# Patient Record
Sex: Female | Born: 1965 | Race: White | Hispanic: No | Marital: Married | Smoking: Current some day smoker
Health system: Southern US, Community
[De-identification: ages and names within clinical notes are randomized; demographics above are authoritative.]

## PROBLEM LIST (undated history)

## (undated) DIAGNOSIS — E785 Hyperlipidemia, unspecified: Secondary | ICD-10-CM

## (undated) DIAGNOSIS — R42 Dizziness and giddiness: Secondary | ICD-10-CM

## (undated) DIAGNOSIS — K219 Gastro-esophageal reflux disease without esophagitis: Secondary | ICD-10-CM

## (undated) DIAGNOSIS — R5383 Other fatigue: Secondary | ICD-10-CM

## (undated) DIAGNOSIS — R112 Nausea with vomiting, unspecified: Secondary | ICD-10-CM

## (undated) DIAGNOSIS — I10 Essential (primary) hypertension: Secondary | ICD-10-CM

## (undated) DIAGNOSIS — F419 Anxiety disorder, unspecified: Secondary | ICD-10-CM

## (undated) DIAGNOSIS — R1310 Dysphagia, unspecified: Secondary | ICD-10-CM

## (undated) DIAGNOSIS — M1990 Unspecified osteoarthritis, unspecified site: Secondary | ICD-10-CM

## (undated) DIAGNOSIS — R197 Diarrhea, unspecified: Secondary | ICD-10-CM

## (undated) DIAGNOSIS — G43909 Migraine, unspecified, not intractable, without status migrainosus: Secondary | ICD-10-CM

## (undated) DIAGNOSIS — Z9889 Other specified postprocedural states: Secondary | ICD-10-CM

## (undated) HISTORY — PX: TUBAL LIGATION: SHX77

## (undated) HISTORY — DX: Gastro-esophageal reflux disease without esophagitis: K21.9

## (undated) HISTORY — DX: Dysphagia, unspecified: R13.10

## (undated) HISTORY — DX: Hyperlipidemia, unspecified: E78.5

## (undated) HISTORY — DX: Essential (primary) hypertension: I10

## (undated) HISTORY — DX: Other fatigue: R53.83

## (undated) HISTORY — DX: Diarrhea, unspecified: R19.7

---

## 1998-10-14 ENCOUNTER — Other Ambulatory Visit: Admission: RE | Admit: 1998-10-14 | Discharge: 1998-10-14 | Payer: Self-pay | Admitting: Obstetrics and Gynecology

## 1999-09-11 ENCOUNTER — Other Ambulatory Visit: Admission: RE | Admit: 1999-09-11 | Discharge: 1999-09-11 | Payer: Self-pay | Admitting: Obstetrics and Gynecology

## 2000-09-04 ENCOUNTER — Other Ambulatory Visit: Admission: RE | Admit: 2000-09-04 | Discharge: 2000-09-04 | Payer: Self-pay | Admitting: Obstetrics and Gynecology

## 2002-01-15 HISTORY — PX: KNEE CARTILAGE SURGERY: SHX688

## 2002-09-23 ENCOUNTER — Other Ambulatory Visit: Admission: RE | Admit: 2002-09-23 | Discharge: 2002-09-23 | Payer: Self-pay | Admitting: Obstetrics and Gynecology

## 2003-10-18 ENCOUNTER — Inpatient Hospital Stay (HOSPITAL_COMMUNITY): Admission: AD | Admit: 2003-10-18 | Discharge: 2003-10-18 | Payer: Self-pay | Admitting: Obstetrics and Gynecology

## 2003-10-21 ENCOUNTER — Inpatient Hospital Stay (HOSPITAL_COMMUNITY): Admission: AD | Admit: 2003-10-21 | Discharge: 2003-10-24 | Payer: Self-pay | Admitting: Obstetrics and Gynecology

## 2003-12-03 ENCOUNTER — Encounter: Admission: RE | Admit: 2003-12-03 | Discharge: 2003-12-03 | Payer: Self-pay | Admitting: Obstetrics and Gynecology

## 2003-12-03 ENCOUNTER — Other Ambulatory Visit: Admission: RE | Admit: 2003-12-03 | Discharge: 2003-12-03 | Payer: Self-pay | Admitting: Obstetrics and Gynecology

## 2004-03-17 ENCOUNTER — Ambulatory Visit (HOSPITAL_COMMUNITY): Admission: RE | Admit: 2004-03-17 | Discharge: 2004-03-17 | Payer: Self-pay | Admitting: Obstetrics and Gynecology

## 2004-06-06 ENCOUNTER — Encounter: Admission: RE | Admit: 2004-06-06 | Discharge: 2004-09-04 | Payer: Self-pay | Admitting: *Deleted

## 2004-06-29 ENCOUNTER — Ambulatory Visit (HOSPITAL_COMMUNITY): Admission: RE | Admit: 2004-06-29 | Discharge: 2004-06-29 | Payer: Self-pay | Admitting: *Deleted

## 2004-07-04 ENCOUNTER — Ambulatory Visit (HOSPITAL_COMMUNITY): Admission: RE | Admit: 2004-07-04 | Discharge: 2004-07-04 | Payer: Self-pay | Admitting: *Deleted

## 2004-09-20 ENCOUNTER — Encounter: Admission: RE | Admit: 2004-09-20 | Discharge: 2004-12-19 | Payer: Self-pay | Admitting: *Deleted

## 2004-11-15 HISTORY — PX: LAPAROSCOPIC GASTRIC BANDING: SHX1100

## 2004-12-05 ENCOUNTER — Encounter (INDEPENDENT_AMBULATORY_CARE_PROVIDER_SITE_OTHER): Payer: Self-pay | Admitting: Specialist

## 2004-12-05 ENCOUNTER — Observation Stay (HOSPITAL_COMMUNITY): Admission: RE | Admit: 2004-12-05 | Discharge: 2004-12-06 | Payer: Self-pay | Admitting: *Deleted

## 2004-12-27 ENCOUNTER — Encounter: Admission: RE | Admit: 2004-12-27 | Discharge: 2005-03-27 | Payer: Self-pay | Admitting: *Deleted

## 2005-04-12 ENCOUNTER — Encounter: Admission: RE | Admit: 2005-04-12 | Discharge: 2005-04-12 | Payer: Self-pay | Admitting: *Deleted

## 2005-11-21 ENCOUNTER — Encounter: Admission: RE | Admit: 2005-11-21 | Discharge: 2006-02-19 | Payer: Self-pay | Admitting: *Deleted

## 2006-05-28 IMAGING — RF DG UGI W/ GASTROGRAFIN
7 series · 11 of 11 positions shown · non-contrast
Comparison: none

CLINICAL DATA: Status post gastric banding yesterday for morbid obesity.

UPPER GI SERIES W/ KUB:
TECHNIQUE: After obtaining a scout radiograph, a single contrast upper GI
series was performed using 50 cc of Gastrografin.

[Series 1: run · 1 of 1 slices shown (1 of 5)]
[im 1/1]
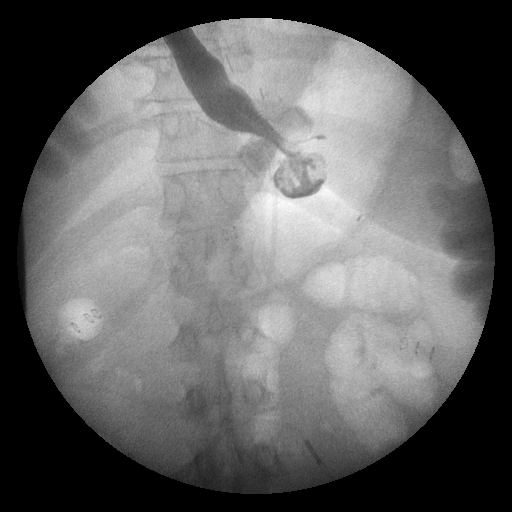

[Series 2: run · 1 of 1 slices shown (2 of 5)]
[im 1/1]
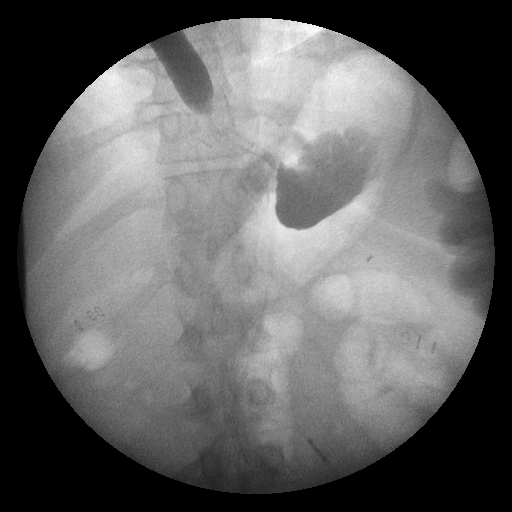

[Series 3: run · 1 of 1 slices shown (3 of 5)]
[im 1/1]
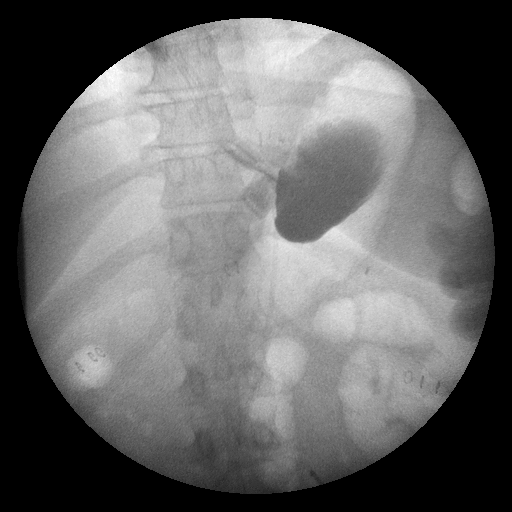

[Series 4: run · 3 of 3 slices shown (4 of 5)]
[im 1/3]
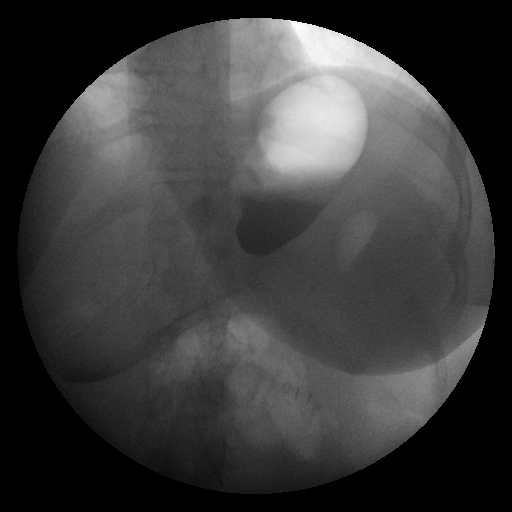
[im 2/3]
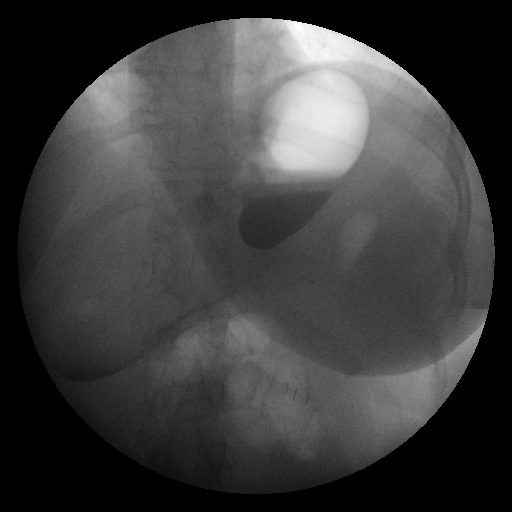
[im 3/3]
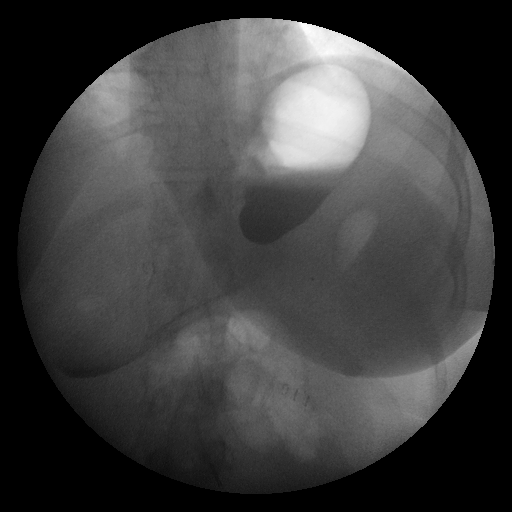

[Series 5: run · 3 of 3 slices shown (5 of 5)]
[im 1/3]
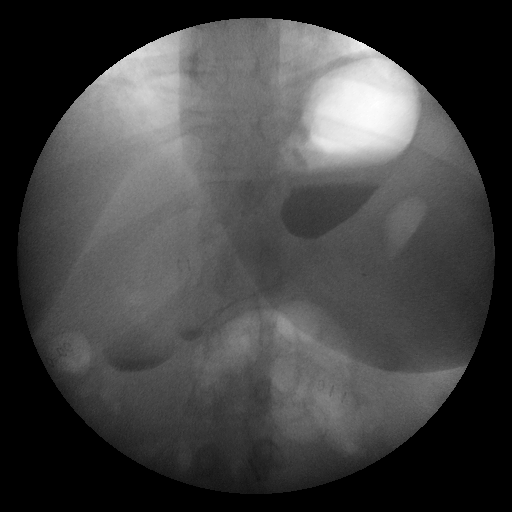
[im 2/3]
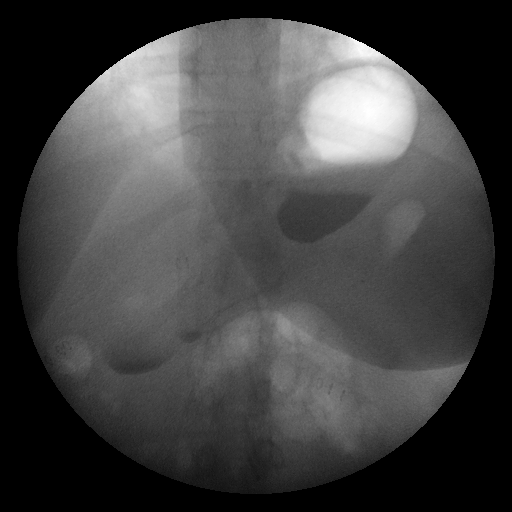
[im 3/3]
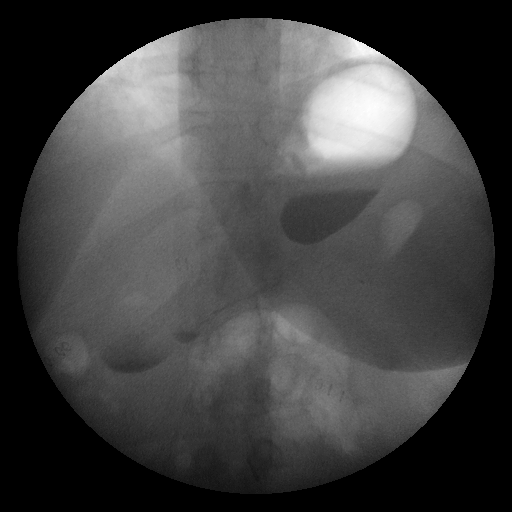

[Series 1001: view not recorded · 0.20mm/px · 1 of 1 slices shown (1 of 2)]
[im 1/1]
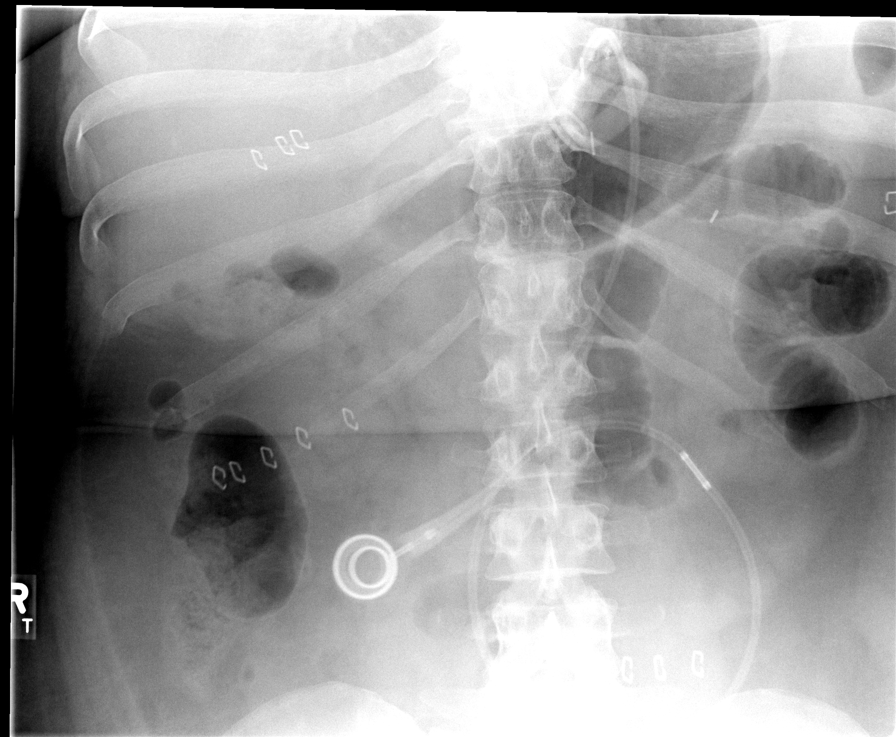

[Series 1002: view not recorded · 0.20mm/px · 1 of 1 slices shown (2 of 2)]
[im 1/1]
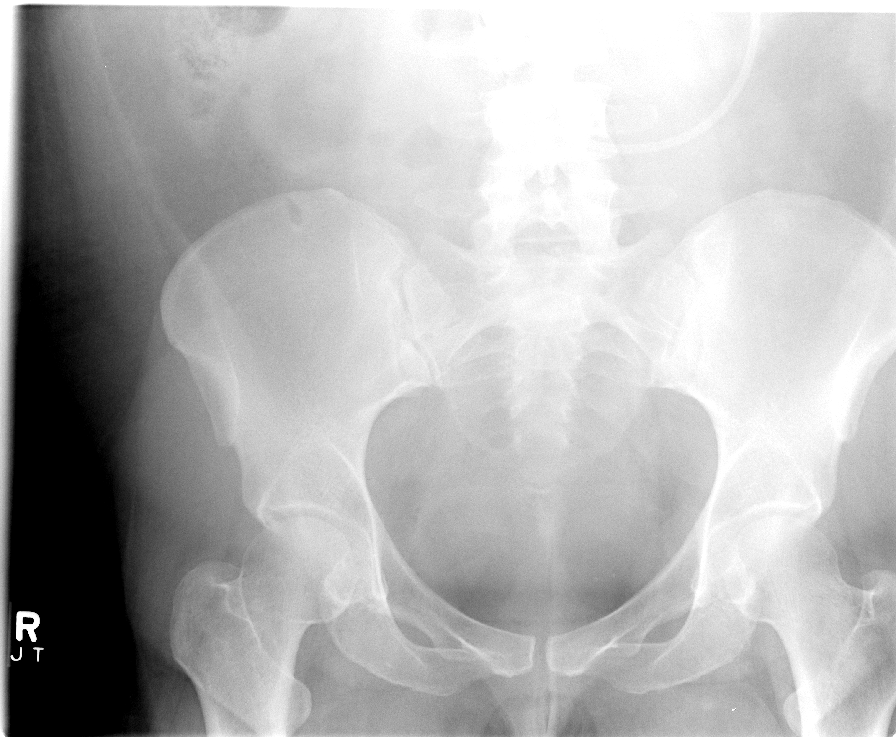

[11 of 11 positions shown; findings below may reference images not displayed]

FINDINGS: The preliminary supine radiographs of the abdomen demonstrate a
gastric band, reservoir and connecting tubing in their expected positions. Skin
clips are noted. Normal bowel gas pattern. Lower thoracic spine degenerative
changes.

The patient swallowed the Gastrografin without difficulty. The distal esophagus
and proximal stomach have normal appearances. No contrast extravasation was
demonstrated and there was free flow of contrast through the gastric band and
into the stomach more distally.
IMPRESSION: Normal examination, status post gastric banding.

## 2007-02-14 ENCOUNTER — Encounter: Admission: RE | Admit: 2007-02-14 | Discharge: 2007-02-14 | Payer: Self-pay | Admitting: Obstetrics and Gynecology

## 2007-02-21 ENCOUNTER — Encounter: Admission: RE | Admit: 2007-02-21 | Discharge: 2007-02-21 | Payer: Self-pay | Admitting: Obstetrics and Gynecology

## 2007-02-28 ENCOUNTER — Inpatient Hospital Stay (HOSPITAL_COMMUNITY): Admission: EM | Admit: 2007-02-28 | Discharge: 2007-03-01 | Payer: Self-pay | Admitting: Emergency Medicine

## 2010-02-05 ENCOUNTER — Encounter: Payer: Self-pay | Admitting: Obstetrics and Gynecology

## 2010-05-30 NOTE — H&P (Signed)
NAMEJULIETT, [name]                ACCOUNT NO.:  [id number]   MEDICAL RECORD NO.:  [id number]          PATIENT TYPE:  EMS   LOCATION:  ED                           FACILITY:  [facility]   PHYSICIAN:  Ollen Gross. Vernell Morgans, M.D. DATE OF BIRTH:  [birth year]   DATE OF ADMISSION:  02/28/2007  DATE OF DISCHARGE:                              HISTORY & PHYSICAL   Ms. [name] is a 45 year old white female who recently had a lap band  procedure done by Dr. Daphine Deutscher a few days ago.  The band was filled, and  afterwards the patient was unable to swallow and has had nausea and  vomiting since then. She has returned to the clinic the last 2 days to  have fluid removed from the band, but still is unable to swallow and is  continuing to vomit.  She denies any fevers.  She denies any abdominal  pain.  No real shortness of breath.   PAST MEDICAL HISTORY:  Obesity.   PAST SURGICAL HISTORY:  Lap band procedure.   MEDICATIONS:  Wellbutrin and multivitamins.   ALLERGIES:  NO KNOWN DRUG ALLERGIES.   SOCIAL HISTORY:  She does smoke.  Denies any alcohol use.   FAMILY HISTORY:  Noncontributory.   PHYSICAL EXAMINATION:  VITAL SIGNS:  Her temperature is 98.3, blood  pressure 118/58, pulse 74.  GENERAL:  She is a well-developed, well-nourished white female, in no  acute distress.  SKIN:  Warm and dry, no jaundice.  EYES:  Her extraocular muscles are intact.  Pupils equal, round, and  reactive to light.  Sclerae nonicteric.  LUNGS:  Clear bilaterally, with no use of accessory respiratory muscles.  HEART:  Regular rate and rhythm, with impulse in the left chest.  ABDOMEN: Soft and nontender.  No guarding or peritonitis.  The cord is  palpable in her right abdomen.  EXTREMITIES:  No cyanosis, clubbing, or edema.  Good strength in her  arms and legs.  PSYCHOLOGICAL:  She is alert and oriented x3, with no evidence of any  anxiety or depression.   ASSESSMENT AND PLAN:  This is a 45 year old white female who has had  some nausea and vomiting since her lab band was filled.  Although this  most likely may be able to be taken care by removing some more fluid  from the lap band, we will also check some abdominal x-rays to make sure  that her band is in good position, and some lab work.  We will plan to  admit her for IV hydration.  Will discuss this with Dr. Daphine Deutscher in the  morning.      Ollen Gross. Vernell Morgans, M.D.  Electronically Signed    PST/MEDQ  D:  02/28/2007  T:  03/03/2007  Job:  [id number]

## 2010-06-02 NOTE — H&P (Signed)
[name], [name]                ACCOUNT NO.:  [id number]   MEDICAL RECORD NO.:  [id number]          PATIENT TYPE:  AMB   LOCATION:  SDC                           FACILITY:  [facility]   PHYSICIAN:  Zenaida Niece, M.D.DATE OF BIRTH:  1965/08/31   DATE OF ADMISSION:  03/17/2004  DATE OF DISCHARGE:                                HISTORY & PHYSICAL   CHIEF COMPLAINT:  Desires surgical sterility and menorrhagia.   HISTORY OF PRESENT ILLNESS:  This is a 45 year old white female, gravida 4,  para 3-0-1-3, who had a vaginal delivery in October of 2005.  She had a  normal postpartum examination in November of 2005, and was initially put on  NuvaRing.  However, she failed to follow up with blood pressure, and wishes  to proceed with definitive surgical sterility.  She also complains of heavy  menses, and says that she may have to be in bed for 2 days due to heavy  bleeding.   PAST OBSTETRICAL HISTORY:  1.  Three vaginal deliveries at term without complications.  2.  A left ectopic pregnancy.   PAST MEDICAL HISTORY:  1.  Migraine headache.  2.  Morbid obesity.  3.  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  4.  History of hypercholesterolemia.  5.  History of depression.   PAST SURGICAL HISTORY:  1.  Laparoscopy for the ectopic pregnancy.  2.  Right knee arthroscopy.  3.  Tonsillectomy.  4.  I&D of a left breast abscess.   ALLERGIES:  None known.   CURRENT MEDICATIONS:  None.   SOCIAL HISTORY:  The patient is married and denies alcohol, tobacco, or drug  use.   FAMILY HISTORY:  No GYN or colon cancer.   PHYSICAL EXAMINATION:  VITAL SIGNS:  Weight is approximately 250 pounds.  Blood pressure is 156/98.  GENERAL:  This is an obese white female in no acute distress.  NECK:  Supple without lymphadenopathy or thyromegaly.  LUNGS:  Clear to auscultation.  HEART:  Regular rate and rhythm without murmur.  ABDOMEN:  Obese, nontender, nondistended, without palpable  masses.  EXTREMITIES:  No edema, and are nontender.  PELVIC:  External genitalia is within normal limits.  On speculum exam, her  cervix is normal.  On bimanual exam, she has an anteverted uterus, upper  limits of normal size, which is nontender, and she has no adnexal masses.   ASSESSMENT:  1.  Desires surgical sterility.  All contraceptive options have been      discussed with the patient.  She understands that tubal fulguration is      permanent, with a 1 in 150 failure rate, and an increased risk of a      tubal pregnancy.  Again, the patient has had her left tube removed      previously from an ectopic pregnancy.  Risks of surgery including      bleeding, infection, and damage to surrounding organs have also been      discussed with the patient.  2.  Menorrhagia.  The patient does not wish to use  oral contraceptives to      control this, and she also has elevated blood pressure.  Surgical      options, including endometrial ablation, have been discussed.   PLAN:  The plan is to admit the patient for a laparoscopy with tubal  fulguration of the right tube, followed by endometrial ablation with the  NovaSure device.      TDM/MEDQ  D:  03/16/2004  T:  03/16/2004  Job:  [id number]

## 2010-06-02 NOTE — Op Note (Signed)
[name], [name]                ACCOUNT NO.:  [id number]   MEDICAL RECORD NO.:  [id number]          PATIENT TYPE:  OBV   LOCATION:  1509                         FACILITY:  [facility]   PHYSICIAN:  Vikki Ports, MDDATE OF BIRTH:  1965/03/16   DATE OF PROCEDURE:  12/05/2004  DATE OF DISCHARGE:                                 OPERATIVE REPORT   PREOPERATIVE DIAGNOSIS:  Morbid obesity.   POSTOPERATIVE DIAGNOSIS:  Morbid obesity.   PROCEDURE:  Laparoscopic adjustable gastric banding with a 10 cm adjustable  gastric band and truncal vagotomy.   SURGEON:  Vikki Ports, MD   ASSISTANT:  Thornton Park. Daphine Deutscher, MD   ANESTHESIA:  General.   DESCRIPTION:  The patient was taken to the operating room and placed in a  supine position.  After adequate general anesthesia was induced using  endotracheal tube, the abdomen was prepped and draped in a normal sterile  fashion.  Using an 11 mm Optiview trocar in the left upper quadrant, under  direct visualization, abdominal access was obtained.  Pneumoperitoneum was  obtained.  Additional 12 mm trocars were placed throughout the abdomen.  A  Nathanson liver retractor was placed in the upper abdomen.  The left lateral  segment of the liver was retracted anteriorly.  Using sharp and blunt  dissection, the angle of His was identified and dissected.  Dissecting  through the pars flaccida, the right crus of the diaphragm was identified,  and a retroesophageal dissection was undertaken, identifying the posterior  vagus nerve.  This posterior trunk was clipped, both proximally and  distally, divided, and a 1 cm segment was resected and sent for gross  pathologic evaluation.  I then turned my attention to the anterior  esophagus, where the anterior vagus nerve was identified in the 11 o'clock  position.  It was clipped, divided, and a 1 cm segment was resected and sent  for gross evaluation.   The stomach had been previously irrigated with  simethicone and water.  An  Olympus endoscope was introduced into the oropharynx and down into the  stomach, retroflexed.  The mucosa of the stomach was then irrigated with  Congo red solution after Baclofen had been infused by the anesthesiologist  through IV administration.  This showed the mucosa of the stomach to stay  red with no change of pigment to black, showing a complete truncal vagotomy.  The Olympus endoscope was then removed.   I then turned my attention to the right crus of the diaphragm, and a  retroesophageal dissection was undertaken with the lap band passer up to the  angle of His.  A 10 cm adjustable gastric band was then placed through the  12 mm trocar in the right upper quadrant and introduced into the abdominal  cavity and passed into the retroesophageal space with the lap band passer.  It was snapped around the upper stomach with the dilator placed down through  the GE junction.  It moved freely.  Using serosal to serosal interrupted 2-0  Ethibond sutures, it was fixed anteriorly.  The buccal was left safely away  from the fixation.  I was satisfied with the placement of the band.  The  balloon was removed.  The [facility] liver retractor was removed.  The tubing  was brought out through the right mid abdominal port.  The port was fixed to  the tubing, and the port was fixed to the anterior abdominal fascia with  interrupted 2-0  Prolene sutures.  Pneumoperitoneum was released.  Trocars were removed.  Skin incisions were closed with subcutaneous 3-0 Vicryl sutures.  Skin  incisions were closed with staples.  Patient tolerated the procedure well  and went to the PACU in good condition.      Vikki Ports, MD  Electronically Signed     KRH/MEDQ  D:  12/05/2004  T:  12/05/2004  Job:  [id number]

## 2010-06-02 NOTE — Op Note (Signed)
[name], [name]                ACCOUNT NO.:  [id number]   MEDICAL RECORD NO.:  [id number]          PATIENT TYPE:  AMB   LOCATION:  SDC                           FACILITY:  [facility]   PHYSICIAN:  Zenaida Niece, M.D.DATE OF BIRTH:  06-25-1965   DATE OF PROCEDURE:  03/17/2004  DATE OF DISCHARGE:                                 OPERATIVE REPORT   PREOPERATIVE AND POSTOPERATIVE DIAGNOSES:  Desires surgical sterility and  menorrhagia.   PROCEDURES:  Laparoscopy with fulguration of right fallopian tube and  Novasure endometrial ablation.   SURGEON:  Zenaida Niece, M.D.   ANESTHESIA:  General.   ESTIMATED BLOOD LOSS:  Minimal.   FINDINGS:  The patient has a surgically absent left fallopian tube with  otherwise normal anatomy. The Novasure device used a uterine depth of 5 cm  uterine width of 4.4 cm and used 121 watts for 55 seconds.   PROCEDURE IN DETAIL:  The patient was taken to the operating room and placed  in the dorsosupine position. General anesthesia was induced and she was  placed in mobile stirrups. Abdomen, perineum and vagina were then prepped  and draped in the usual sterile fashion and bladder drained with a red  rubber catheter. A Hulka tenaculum was applied to the cervix for uterine  manipulation. Infraumbilical skin was infiltrated with 0.25% Marcaine and  1.5 cm horizontal incision was made. The Veress needle was inserted in the  peritoneal cavity and placement confirmed by the water drop test and opening  pressure of 6 mmHg. CO2 gas was insufflated to a pressure of 12 and the  Veress needle was removed. The size 11 disposable trocar was then introduced  and placement confirmed by laparoscope. On inspection of the pelvis revealed  normal anatomy with surgical absence of the left fallopian tube. The right  fallopian tube was identified and traced to its fimbriated end. Three  segments of that tube were fulgurated with bipolar cautery until the amp  meter read  zero. This was done with good fulguration and good hemostasis.  All other anatomy appeared normal. The laparoscope was removed and all gas  allowed to deflate from the abdomen. The trocar was then removed. Skin  incision was closed with interrupted subcuticular sutures of 4-0 Vicryl  followed by Steri-Strips and bandage.   Attention was turned to endometrial ablation. The legs were elevated  slightly and a Graves speculum inserted in the vagina after the Hulka  tenaculum was removed. A single-tooth tenaculum was then placed on the  anterior lip of the cervix. The uterus then sounded to 10 cm and the cervix  measured 5 cm. Cervix was gradually easily dilated to a size 23 dilator. The  Novasure device was then introduced and deployed appropriately. It then  passed the cavity assessment and endometrial ablation was performed as  mentioned above without complications. At the end of the procedure the  device was removed and found to be intact. The single-tooth tenaculum was  removed and bleeding  controlled with pressure. The speculum was then removed from the vagina. The  patient tolerated the  procedure well, was extubated in the operating room  and taken to recovery room in stable condition. Counts were correct x2 and  she received no antibiotics.      TDM/MEDQ  D:  03/17/2004  T:  03/17/2004  Job:  [id number]

## 2010-06-02 NOTE — Discharge Summary (Signed)
NAMENAVEYA, ELLERMAN                ACCOUNT NO.:  [id number]   MEDICAL RECORD NO.:  [id number]          PATIENT TYPE:  INP   LOCATION:  9110                          FACILITY:  [facility]   PHYSICIAN:  Malachi Pro. Ambrose Mantle, M.D. DATE OF BIRTH:  12/19/1965   DATE OF ADMISSION:  10/21/2003  DATE OF DISCHARGE:  10/24/2003                                 DISCHARGE SUMMARY   A 45 year old white female, para 2-0-1-2, gravida 4, by an 8 week ultrasound  with [facility] October 24, 2003, presented complaining of harder contractions, no  vaginal bleeding or ruptured membranes, good fetal movement.  The patient  was evaluated in maternity admission unit with the cervix 5 cm dilated and  irregular contractions.  She was admitted, and her contractions spaced out,  started on Pitocin, and then received an epidural.  Blood group and type was  A positive with a negative antibody, RPR nonreactive, rubella immune,  hepatitis B surface antigen negative, HIV declined.  GC and Chlamydia  negative.  One hour Glucola 135.  Group B strep negative.  The patient  underwent amniocentesis with normal 46 XY chromosomes and normal amniotic  fluid AFP.   Her prenatal course was complicated by advanced maternal age with a normal  22 XY by amnio, obesity, and reflux.  Treated with over-the-counter Zantac.  Reactive nonstress test was done for decreased fetal movement.   OBSTETRIC HISTORY:  1.  In 1992, a spontaneous vaginal delivery at 40 weeks, 7 pound 11 ounce      infant.  2.  In 1995, spontaneous vaginal delivery at 40 weeks, 8 pound infant.  3.  In 1996, a left tubal pregnancy with salpingectomy.   PAST MEDICAL HISTORY:  1.  Obesity.  2.  High cholesterol.  3.  Migraines.   PAST SURGICAL HISTORY:  Laparoscopic salpingectomy.   MEDICATIONS:  Zantac b.i.d.   The patient is married and does not smoke.   FAMILY HISTORY:  A niece with chromosomal defect.   PHYSICAL EXAM ON ADMISSION:  VITAL SIGNS:  Weight 260 pounds.   Vital signs  were normal.  ABDOMEN:  Obese and gravid, fetal weight of approximately 8-1/2 pounds.  Fetal heart tones reactive.  Contractions every 3 minutes.  CERVIX:  4-5 cm, 70%.   Artificial rupture of membranes produced clear fluid.  Scalp electrode was  applied.  The patient possibly wanted a BTL.  By 3:50 a.m., she was 8-9 cm.  She progressed to fill dilatation and pushed well with delivery of a 8 pound  2 ounce female with Apgars of 8 and 9 at 1 and 5 minutes.  There was a loose  nuchal cord that was reduced.  Placenta was spontaneous and intact.  Perineum was intact.  Blood loss less than 500 mL.  The patient elected to  defer her decision for tubal ligation until later.  Dr. Jackelyn Knife was in  attendance postpartum, and the patient did well.  She declined to have tubal  ligation, and she was discharged on the 2noted postpartum day.  Hemoglobin  was 12.8, hematocrit 36.9, white count 9500, platelet count  253,000.  Follow  up hematocrit 33.9, RPR nonreactive.   FINAL DIAGNOSES:  1.  Intrauterine pregnancy at 39+ weeks.  2.  Delivered vertex.   OPERATION:  Spontaneous delivery vertex.   FINAL CONDITION:  Improved.   INSTRUCTIONS:  Our regular discharge instruction booklet.  1.  Ibuprofen 600  mg 24 tabs 1 q.6h. p.r.n. pain with 1 refill is given at discharge.  She is  advised to return to the office in 6 weeks for follow-up examination.      TFH/MEDQ  D:  10/24/2003  T:  10/24/2003  Job:  [id number]

## 2010-08-22 ENCOUNTER — Emergency Department (HOSPITAL_COMMUNITY)
Admission: EM | Admit: 2010-08-22 | Discharge: 2010-08-22 | Disposition: A | Discharge status: Home / Self Care | Payer: Managed Care, Other (non HMO) | Attending: Emergency Medicine | Admitting: Emergency Medicine

## 2010-08-22 ENCOUNTER — Encounter (HOSPITAL_COMMUNITY): Payer: Self-pay | Admitting: Radiology

## 2010-08-22 ENCOUNTER — Emergency Department (HOSPITAL_COMMUNITY): Payer: Managed Care, Other (non HMO)

## 2010-08-22 DIAGNOSIS — R112 Nausea with vomiting, unspecified: Secondary | ICD-10-CM | POA: Insufficient documentation

## 2010-08-22 DIAGNOSIS — R42 Dizziness and giddiness: Secondary | ICD-10-CM | POA: Insufficient documentation

## 2010-08-22 DIAGNOSIS — R197 Diarrhea, unspecified: Secondary | ICD-10-CM | POA: Insufficient documentation

## 2010-08-22 DIAGNOSIS — F411 Generalized anxiety disorder: Secondary | ICD-10-CM | POA: Insufficient documentation

## 2010-08-22 HISTORY — DX: Migraine, unspecified, not intractable, without status migrainosus: G43.909

## 2010-08-22 LAB — DIFFERENTIAL
Basophils Absolute: 0 10*3/uL (ref 0.0–0.1)
Basophils Relative: 0 % (ref 0–1)
Eosinophils Absolute: 0 10*3/uL (ref 0.0–0.7)
Eosinophils Relative: 0 % (ref 0–5)
Lymphocytes Relative: 13 % (ref 12–46)
Lymphs Abs: 0.7 10*3/uL (ref 0.7–4.0)
Monocytes Absolute: 0.1 10*3/uL (ref 0.1–1.0)
Monocytes Relative: 2 % — ABNORMAL LOW (ref 3–12)
Neutro Abs: 4.8 10*3/uL (ref 1.7–7.7)
Neutrophils Relative %: 85 % — ABNORMAL HIGH (ref 43–77)

## 2010-08-22 LAB — CBC
HCT: 39.2 % (ref 36.0–46.0)
Hemoglobin: 13.2 g/dL (ref 12.0–15.0)
MCH: 29.9 pg (ref 26.0–34.0)
MCHC: 33.7 g/dL (ref 30.0–36.0)
MCV: 88.7 fL (ref 78.0–100.0)
Platelets: 209 10*3/uL (ref 150–400)
RBC: 4.42 MIL/uL (ref 3.87–5.11)
RDW: 12 % (ref 11.5–15.5)
WBC: 5.7 10*3/uL (ref 4.0–10.5)

## 2010-08-22 LAB — POCT I-STAT, CHEM 8
BUN: 9 mg/dL (ref 6–23)
Calcium, Ion: 1.13 mmol/L (ref 1.12–1.32)
Chloride: 108 mEq/L (ref 96–112)
Creatinine, Ser: 0.5 mg/dL (ref 0.50–1.10)
Glucose, Bld: 137 mg/dL — ABNORMAL HIGH (ref 70–99)
HCT: 41 % (ref 36.0–46.0)
Hemoglobin: 13.9 g/dL (ref 12.0–15.0)
Potassium: 4.1 mEq/L (ref 3.5–5.1)
Sodium: 138 mEq/L (ref 135–145)
TCO2: 18 mmol/L (ref 0–100)

## 2010-08-22 LAB — POCT I-STAT TROPONIN I: Troponin i, poc: 0 ng/mL (ref 0.00–0.08)

## 2010-10-06 LAB — BASIC METABOLIC PANEL
BUN: 12
CO2: 21
Calcium: 8.5
Chloride: 108
Creatinine, Ser: 0.85
GFR calc Af Amer: >60
GFR calc non Af Amer: >60
Glucose, Bld: 91
Potassium: 3.6
Sodium: 138

## 2010-10-06 LAB — D-DIMER, QUANTITATIVE: D-Dimer, Quant: 0.31

## 2010-10-06 LAB — DIFFERENTIAL
Basophils Absolute: 0
Basophils Relative: 0
Eosinophils Absolute: 0
Eosinophils Relative: 0
Lymphocytes Relative: 18
Lymphs Abs: 1.8
Monocytes Absolute: 0.4
Monocytes Relative: 4
Neutro Abs: 7.5
Neutrophils Relative %: 77

## 2010-10-06 LAB — CBC
HCT: 40.9
Hemoglobin: 14.5
MCHC: 35.5
MCV: 88.6
Platelets: 267
RBC: 4.62
RDW: 12.1
WBC: 9.7

## 2010-10-26 ENCOUNTER — Encounter (INDEPENDENT_AMBULATORY_CARE_PROVIDER_SITE_OTHER): Payer: Self-pay | Admitting: General Surgery

## 2010-10-26 ENCOUNTER — Ambulatory Visit (INDEPENDENT_AMBULATORY_CARE_PROVIDER_SITE_OTHER): Payer: Managed Care, Other (non HMO) | Admitting: General Surgery

## 2010-10-26 VITALS — BP 130/90 | HR 64 | Temp 98.0°F | Resp 18 | Ht 62.0 in | Wt 201.1 lb

## 2010-10-26 DIAGNOSIS — R1110 Vomiting, unspecified: Secondary | ICD-10-CM

## 2010-10-26 NOTE — Progress Notes (Signed)
Chief Complaint  Patient presents with  . Lap Band Fill    pt wants to discuss lap band conversion to gastric bypass      HPI [name] is a 45 y.o. female.  This patient presents today for evaluation for possible revisional weight loss surgery. She's had a 10 cm lap band was placed approximately 6 years ago with a truncal vagotomy. She states that she has lost about 60 pounds from this procedure but began having intermittent vomiting approximately 3 years ago. She states that for the last 2 years she's been throwing up "every bite". She states that she can only keep down soft foods such as " Icees and smoothies" and other "complex carbohydrates". Occasionally she says that she can keep food down when she lies prone. However, over this 2 years since she's been throwing up she states that her weight and unchanged and is only approximately 50 pounds down from her preoperative weight. She does not have any dysphasia that she "feels full in her chest". She also has difficulty with liquids if she drinks too fast. She eats out frequently. Asked about exercise she states that she walks frequently and has a gym membership but she does not attend the gym because she feels tired all the time. She was admitted to the hospital 3 years ago and at that time she states that she had all of the fluid removed from her band by Dr. Daphine Deutscher and she has not had any fluid added since.She had an upper GI performed at [facility] 3 weeks ago but the result is not available at this time. She also saw a surgeon over there who recommended revision to a gastric bypass but she states that it would not be covered by her insurance because they were not the center of excellence. HPI  Past Medical History  Diagnosis Date  . Migraines   . GERD (gastroesophageal reflux disease)   . Hyperlipidemia   . Hypertension   . Trouble swallowing   . Constipation   . Diarrhea   . Nausea and vomiting   . Fatigue   . Migraines      Past Surgical History  Procedure Date  . Laparoscopic gastric banding 11/2004  . Knee cartilage surgery 2004    right knee    Family History  Problem Relation Age of Onset  . Diabetes Mother     Social History History  Substance Use Topics  . Smoking status: Former Games developer  . Smokeless tobacco: Never Used  . Alcohol Use: No    No Known Allergies  No current outpatient prescriptions on file.    Review of Systems Review of Systems  HENT: Positive for trouble swallowing.   Gastrointestinal: Positive for nausea, vomiting, diarrhea and constipation.  Neurological: Positive for weakness and headaches.    Blood pressure 130/90, pulse 64, temperature 98 F (36.7 C), resp. rate 18, height 5\' 2"  (1.575 m), weight 201 lb 2 oz (91.23 kg).  Physical Exam Physical Exam  Constitutional: She is oriented to person, place, and time. She appears well-developed and well-nourished. No distress.  HENT:  Head: Normocephalic and atraumatic.  Mouth/Throat: No oropharyngeal exudate.  Eyes: Conjunctivae and EOM are normal. Pupils are equal, round, and reactive to light. Right eye exhibits no discharge. Left eye exhibits no discharge. No scleral icterus.  Neck: Normal range of motion. No tracheal deviation present.  Cardiovascular: Normal rate, regular rhythm and normal heart sounds.   Pulmonary/Chest: Effort normal and breath sounds  normal. No stridor. No respiratory distress. She has no wheezes.  Abdominal: Soft. Bowel sounds are normal. She exhibits no distension. There is no tenderness. There is no rebound.  Musculoskeletal: Normal range of motion.  Neurological: She is alert and oriented to person, place, and time.  Skin: Skin is warm and dry. No rash noted. She is not diaphoretic.  Psychiatric: She has a normal mood and affect. Her behavior is normal. Judgment and thought content normal.    Data Reviewed   Assessment    Dysphagia and vomiting. This may be a complication from  her band but she states that there is no fluid in the band. This may be due to slippage or malplacement. She appears to have severe symptoms without any definable cause at this time. She is interested in revision to gastric bypass and then removal. She declined any further management of her obesity with her band even if the band appears to be in adequate position and no apparent complications. She states that she wants the band out. I discussed with her the possibility of band removal and a staged procedure if she is unable to lose the weight on her own and she declined this option as well. She states that she did not trust herself to eat appropriate foods and she has been in a bad habit of eating slider foods for the last 3 years.    Plan    I have reviewed her case with Dr. Daphine Deutscher and we will obtain her upper GI from [facility] and restart her in the process for bariatric surgery with laboratory studies and psychiatric evaluation. If the revision is planned, I would recommend gastric bypass given her dysphagia. I will set her up with an appointment for Dr. Daphine Deutscher as well.       Lodema Pilot DAVID 10/26/2010, 12:59 PM

## 2010-11-09 ENCOUNTER — Ambulatory Visit (INDEPENDENT_AMBULATORY_CARE_PROVIDER_SITE_OTHER): Payer: Managed Care, Other (non HMO) | Admitting: Surgery

## 2010-11-23 ENCOUNTER — Ambulatory Visit (INDEPENDENT_AMBULATORY_CARE_PROVIDER_SITE_OTHER): Payer: Managed Care, Other (non HMO) | Admitting: Surgery

## 2010-11-23 ENCOUNTER — Encounter (INDEPENDENT_AMBULATORY_CARE_PROVIDER_SITE_OTHER): Payer: Self-pay | Admitting: Surgery

## 2010-11-23 DIAGNOSIS — Z9884 Bariatric surgery status: Secondary | ICD-10-CM

## 2010-11-23 DIAGNOSIS — Z9889 Other specified postprocedural states: Secondary | ICD-10-CM

## 2010-11-23 NOTE — Progress Notes (Signed)
Chief Complaint  Patient presents with  . Gastrophageal Reflux    HPI [name] is a 45 y.o. female.  This patient presents today for evaluation for possible revisional weight loss surgery. She's had a 10 cm lap band was placed approximately 6 years ago with a truncal vagotomy. She states that she has lost about 60 pounds from this procedure but began having intermittent vomiting approximately 3 years ago. She states that for the last 2 years she's been throwing up "every bite". She states that she can only keep down soft foods such as " Icees and smoothies" and other "complex carbohydrates". Occasionally she says that she can keep food down when she lies prone. However, over this 2 years since she's been throwing up she states that her weight and unchanged and is only approximately 50 pounds down from her preoperative weight. She does not have any dysphasia that she "feels full in her chest". She also has difficulty with liquids if she drinks too fast. She eats out frequently. Asked about exercise she states that she walks frequently and has a gym membership but she does not attend the gym because she feels tired all the time. She was admitted to the hospital 3 years ago and at that time she states that she had all of the fluid removed from her band by Dr. Daphine Deutscher and she has not had any fluid added since.She had an upper GI performed at [facility] 3 weeks ago but the result is not available at this time. She also saw a surgeon over there who recommended revision to a gastric bypass but she states that it would not be covered by her insurance because they were not the center of excellence. HPI  Past Medical History  Diagnosis Date  . Migraines   . GERD (gastroesophageal reflux disease)   . Hyperlipidemia   . Hypertension   . Trouble swallowing   . Constipation   . Diarrhea   . Nausea and vomiting   . Fatigue   . Migraines     Past Surgical History  Procedure Date  .  Laparoscopic gastric banding 11/2004  . Knee cartilage surgery 2004    right knee    Family History  Problem Relation Age of Onset  . Diabetes Mother     Social History History  Substance Use Topics  . Smoking status: Former Games developer  . Smokeless tobacco: Never Used  . Alcohol Use: No    No Known Allergies  No current outpatient prescriptions on file.    Review of Systems Review of Systems  HENT: Positive for trouble swallowing.   Gastrointestinal: Positive for nausea, vomiting, diarrhea and constipation.  Neurological: Positive for weakness and headaches.    Blood pressure 138/78, pulse 70, temperature 98.8 F (37.1 C), temperature source Temporal, resp. rate 16, height 5' 2.5" (1.588 m), weight 205 lb 6 oz (93.157 kg).  Physical Exam Physical Exam  Constitutional: She is oriented to person, place, and time. She appears well-developed and well-nourished. No distress.  HENT:  Head: Normocephalic and atraumatic.  Mouth/Throat: No oropharyngeal exudate.  Eyes: Conjunctivae and EOM are normal. Pupils are equal, round, and reactive to light. Right eye exhibits no discharge. Left eye exhibits no discharge. No scleral icterus.  Neck: Normal range of motion. No tracheal deviation present.  Cardiovascular: Normal rate, regular rhythm and normal heart sounds.   Pulmonary/Chest: Effort normal and breath sounds normal. No stridor. No respiratory distress. She has no wheezes.  Abdominal: Soft.  Bowel sounds are normal. She exhibits no distension. There is no tenderness. There is no rebound.  Musculoskeletal: Normal range of motion.  Neurological: She is alert and oriented to person, place, and time.  Skin: Skin is warm and dry. No rash noted. She is not diaphoretic.  Psychiatric: She has a normal mood and affect. Her behavior is normal. Judgment and thought content normal.    Data Reviewed   Assessment    Dysphagia and vomiting. This may be a complication from her band but she  states that there is no fluid in the band. This may be due to slippage or malplacement. She appears to have severe symptoms without any definable cause at this time. She is interested in revision to gastric bypass and then removal. She declined any further management of her obesity with her band even if the band appears to be in adequate position and no apparent complications. She states that she wants the band out. I discussed with her the possibility of band removal and a staged procedure if she is unable to lose the weight on her own and she declined this option as well. She states that she did not trust herself to eat appropriate foods and she has been in a bad habit of eating slider foods for the last 3 years.    Plan    I have reviewed her case with Dr. Daphine Deutscher and we will obtain her upper GI from [facility] and restart her in the process for bariatric surgery with laboratory studies and psychiatric evaluation. If the revision is planned, I would recommend gastric bypass given her dysphagia. I will set her up with an appointment for Dr. Daphine Deutscher as well.       Montavius Subramaniam B 11/23/2010, 12:33 PM   [name] comes in today and I discussed her with Dr. Biagio Quint. Had seen her in the hospital and removed the remaining fluid from her band. She had a upper GI at [facility] which am reviewing that showed slow passage of barium through her lab in although the band is in good position. She is one of our band vagotomy patient's and she reports no diarrhea and she does have occasional hunger but not crazy hungry pains. She is done her home were and wants to have her band revised to gastric bypass. We have done it here and when moved toward scheduling her for removal of her band which may be encased in some cicatrix and converted to a Roux-en-Y gastric bypass.  She does have some other family health issues with her husband was recently diagnosed with Parkinson's disease and has had 2 failed  back operation.  Avoid get her over to see Okey Regal for scheduling for removal of her lab band and conversion to Roux-en-Y gastric bypass.

## 2010-12-25 ENCOUNTER — Other Ambulatory Visit: Payer: Self-pay | Admitting: Obstetrics and Gynecology

## 2010-12-25 DIAGNOSIS — Z1231 Encounter for screening mammogram for malignant neoplasm of breast: Secondary | ICD-10-CM

## 2011-01-03 NOTE — Patient Instructions (Signed)
Follow:    Pre-Op Diet per MD 2 weeks prior to surgery  Phase 2- Liquids (clear/full) 2 weeks after surgery  Vitamin/Mineral/Calcium guidelines for purchasing bariatric supplements  Exercise guidelines pre and post-op per MD  Follow-up at [facility] in 2 weeks post-op for diet advancement. Contact Gabrielly Mccrystal as needed with questions/concerns.  

## 2011-01-03 NOTE — Progress Notes (Signed)
  Bariatric Class:  Appt start time: 1700 end time:  1800.  Pre-Operative Nutrition Class  Patient was seen on 01/04/11 for Pre-Operative Nutrition [facility]   Surgery date: 01/29/11 Surgery type: Gastric Bypass  Weight today: 210.7  BMI: 38  Samples given per MNT protocol:  Bariatric Advantage Multivitamin  Lot # [id number]  Exp: 9/13   Bariatric Advantage Calcium Citrate  Lot # [id number]  Exp: 12/13   Celebrate VitaminsCalcium Citrate  Lot # [id number]  Exp: 4/13   Unjury Protein - Chicken Soup  Lot # [id number]  Exp: 8/13   The following the learning objective met by the patient during this course:   Identifies Pre-Op Dietary Goals and will begin 2 weeks pre-operatively   Identifies appropriate sources of fluids and proteins   States protein recommendations and appropriate sources pre and post-operatively  Identifies Post-Operative Dietary Goals and will follow for 2 weeks post-operatively  Identifies appropriate multivitamin and calcium sources  Describes the need for physical activity post-operatively and will follow MD recommendations  States when to call healthcare provider regarding medication questions or post-operative complications  Handouts given during class include:  Pre-Op Bariatric Surgery Diet Handout  Protein Shake Handout  Post-Op Bariatric Surgery Nutrition Handout  BELT Program Information Flyer  Support Group Information Flyer  Follow-Up Plan: Patient will follow-up at [facility] 2 weeks post operatively for diet advancement per MD.

## 2011-01-04 ENCOUNTER — Encounter: Payer: Self-pay | Admitting: *Deleted

## 2011-01-04 ENCOUNTER — Encounter: Payer: Managed Care, Other (non HMO) | Attending: Surgery | Admitting: *Deleted

## 2011-01-04 DIAGNOSIS — Z01818 Encounter for other preprocedural examination: Secondary | ICD-10-CM | POA: Insufficient documentation

## 2011-01-04 DIAGNOSIS — Z713 Dietary counseling and surveillance: Secondary | ICD-10-CM | POA: Insufficient documentation

## 2011-01-12 ENCOUNTER — Ambulatory Visit (INDEPENDENT_AMBULATORY_CARE_PROVIDER_SITE_OTHER): Payer: Managed Care, Other (non HMO) | Admitting: Surgery

## 2011-01-12 ENCOUNTER — Encounter (HOSPITAL_COMMUNITY): Payer: Self-pay | Admitting: Pharmacy Technician

## 2011-01-19 ENCOUNTER — Telehealth (INDEPENDENT_AMBULATORY_CARE_PROVIDER_SITE_OTHER): Payer: Self-pay | Admitting: General Surgery

## 2011-01-19 NOTE — Telephone Encounter (Signed)
Meriam Sprague called and stated that she needs orders in Epic. Pt has their per op is 01-22-11. Beverly call back number [phone]

## 2011-01-23 ENCOUNTER — Encounter (HOSPITAL_COMMUNITY): Payer: Self-pay

## 2011-01-23 ENCOUNTER — Encounter (HOSPITAL_COMMUNITY)
Admission: RE | Admit: 2011-01-23 | Discharge: 2011-01-23 | Disposition: A | Discharge status: Home / Self Care | Payer: Managed Care, Other (non HMO) | Source: Ambulatory Visit | Attending: Surgery | Admitting: Surgery

## 2011-01-23 DIAGNOSIS — R42 Dizziness and giddiness: Secondary | ICD-10-CM

## 2011-01-23 HISTORY — DX: Other specified postprocedural states: R11.2

## 2011-01-23 HISTORY — DX: Dizziness and giddiness: R42

## 2011-01-23 HISTORY — DX: Other specified postprocedural states: Z98.890

## 2011-01-23 HISTORY — PX: OTHER SURGICAL HISTORY: SHX169

## 2011-01-23 HISTORY — DX: Anxiety disorder, unspecified: F41.9

## 2011-01-23 LAB — CBC
HCT: 36.2 % (ref 36.0–46.0)
Hemoglobin: 12.6 g/dL (ref 12.0–15.0)
MCH: 30.6 pg (ref 26.0–34.0)
MCHC: 34.8 g/dL (ref 30.0–36.0)
MCV: 87.9 fL (ref 78.0–100.0)
Platelets: 237 10*3/uL (ref 150–400)
RBC: 4.12 MIL/uL (ref 3.87–5.11)
RDW: 12.3 % (ref 11.5–15.5)
WBC: 4.6 10*3/uL (ref 4.0–10.5)

## 2011-01-23 LAB — BASIC METABOLIC PANEL
BUN: 14 mg/dL (ref 6–23)
CO2: 25 mEq/L (ref 19–32)
Calcium: 9.5 mg/dL (ref 8.4–10.5)
Chloride: 104 mEq/L (ref 96–112)
Creatinine, Ser: 0.72 mg/dL (ref 0.50–1.10)
GFR calc Af Amer: >90 mL/min (ref >90)
GFR calc non Af Amer: >90 mL/min (ref >90)
Glucose, Bld: 98 mg/dL (ref 70–99)
Potassium: 4.2 mEq/L (ref 3.5–5.1)
Sodium: 139 mEq/L (ref 135–145)

## 2011-01-23 LAB — HCG, SERUM, QUALITATIVE: Preg, Serum: NEGATIVE

## 2011-01-23 LAB — SURGICAL PCR SCREEN
MRSA, PCR: NEGATIVE
Staphylococcus aureus: NEGATIVE

## 2011-01-23 NOTE — Patient Instructions (Signed)
20 DAVIA SMYRE  01/23/2011   Your procedure is scheduled on: 01-29-11  Report to [facility] at  0515 AM.  Call this number if you have problems the morning of surgery: [phone]   Remember:   Do not eat food:After Midnight.  May have clear liquids:until Midnight .  Clear liquids include soda, tea, black coffee, apple or grape juice, broth.  Take these medicines the morning of surgery with A SIP OF WATER: Tylenol   Do not wear jewelry, make-up or nail polish.  Do not wear lotions, powders, or perfumes. You may wear deodorant.  Do not shave 48 hours prior to surgery.  Do not bring valuables to the hospital.  Contacts, dentures or bridgework may not be worn into surgery.  Leave suitcase in the car. After surgery it may be brought to your room.  For patients admitted to the hospital, checkout time is 11:00 AM the day of discharge.   Patients discharged the day of surgery will not be allowed to drive home.  Name and phone number of your driver: spouse  Special Instructions: CHG Shower Use Special Wash: 1/2 bottle night before surgery and 1/2 bottle morning of surgery.   Please read over the following fact sheets that you were given: MRSA Information

## 2011-01-23 NOTE — Pre-Procedure Instructions (Addendum)
01-23-11 EKG (08-22-10) with chart. No Epic entry orders per [facility]-spoke with Christy-01-23-11.

## 2011-01-25 ENCOUNTER — Ambulatory Visit (INDEPENDENT_AMBULATORY_CARE_PROVIDER_SITE_OTHER): Payer: Managed Care, Other (non HMO) | Admitting: Surgery

## 2011-01-25 ENCOUNTER — Ambulatory Visit
Admission: RE | Admit: 2011-01-25 | Discharge: 2011-01-25 | Disposition: A | Discharge status: Home / Self Care | Payer: Managed Care, Other (non HMO) | Source: Ambulatory Visit | Attending: Obstetrics and Gynecology | Admitting: Obstetrics and Gynecology

## 2011-01-25 ENCOUNTER — Encounter (INDEPENDENT_AMBULATORY_CARE_PROVIDER_SITE_OTHER): Payer: Self-pay | Admitting: Surgery

## 2011-01-25 DIAGNOSIS — Z1231 Encounter for screening mammogram for malignant neoplasm of breast: Secondary | ICD-10-CM

## 2011-01-25 MED ORDER — PEG 3350-KCL-NA BICARB-NACL 420 G PO SOLR: 4000.0000 mL | Freq: Once | ORAL | Status: AC

## 2011-01-25 NOTE — Patient Instructions (Signed)
Do bowel prep before surgery

## 2011-01-25 NOTE — Progress Notes (Signed)
Chief Complaint:  I want my band removed.  I've been throwing up for 2 years.    History of Present Illness:  [name] is an 46 y.o. female who comes in today for a preop visit for her upcoming removal of her 10 cm lap band and conversion to a Roux-en-Y gastric bypass. Her surgery was done in November 06 and she was a vagotomy research patient. She last had intervention our office in February 10 and her weight was down to 227 appropriate wait of 254. She returns today with a weight of 210 and says that she had minimal bleeding thing or drink much for anything for 2 years. I went ahead this time and insisted on attempting to aspirate or bands to make sure nothing was in there. There was in fact 2 cc of fluid in her band. She is side up and ready for conversion to a Roux-en-Y gastric bypass and was to lose another 60 pounds. We will proceed as planned. She has no further questions.  Past Medical History  Diagnosis Date  . Migraines   . GERD (gastroesophageal reflux disease)   . Hyperlipidemia   . Hypertension   . Trouble swallowing   . Constipation   . Diarrhea   . Fatigue   . Migraines   . PONV (postoperative nausea and vomiting) 01-23-11    -PONV  . Vertigo 01-23-11    recent -took Meclicine-now improved  . Nausea and vomiting 01-23-11    lap band being coverted to Gastric bypass  . Anxiety 01-23-11    mild    Past Surgical History  Procedure Date  . Laparoscopic gastric banding 11/2004  . Knee cartilage surgery 2004    right knee  . Tubal ligation   . Childbirth 01-23-11    NVD    Current Outpatient Prescriptions  Medication Sig Dispense Refill  . acetaminophen (TYLENOL) 500 MG tablet Take 1,000 mg by mouth every 6 (six) hours as needed. Pain         Review of patient's allergies indicates no known allergies. Family History  Problem Relation Age of Onset  . Diabetes Mother    Social History:   reports that she quit smoking about 3 months ago. She has never used smokeless  tobacco. She reports that she does not drink alcohol or use illicit drugs.   REVIEW OF SYSTEMS - PERTINENT POSITIVES ONLY: noncontributory  Physical Exam:   Blood pressure 116/74, pulse 68, temperature 96.8 F (36 C), temperature source Temporal, resp. rate 20, height 5\' 2"  (1.575 m), weight 210 lb (95.255 kg). Body mass index is 38.41 kg/(m^2).  Gen:  WDWN white female NAD  Neurological: Alert and oriented to person, place, and time. Motor and sensory function is grossly intact  Head: Normocephalic and atraumatic.  Eyes: Conjunctivae are normal. Pupils are equal, round, and reactive to light. No scleral icterus.  Neck: Normal range of motion. Neck supple. No tracheal deviation or thyromegaly present.  Cardiovascular:  SR without murmurs or gallops.  No carotid bruits Respiratory: Effort normal.  No respiratory distress. No chest wall tenderness. Breath sounds normal.  No wheezes, rales or rhonchi.  Abdomen:  Very ptotic panniculus GU: Musculoskeletal: Normal range of motion. Extremities are nontender. No cyanosis, edema or clubbing noted Lymphadenopathy: No cervical, preauricular, postauricular or axillary adenopathy is present Skin: Skin is warm and dry. No rash noted. No diaphoresis. No erythema. No pallor. Pscyh: Normal mood and affect. Behavior is normal. Judgment and thought content normal.  LABORATORY RESULTS: Results for orders placed during the hospital encounter of 01/23/11 (from the past 48 hour(s))  CBC     Status: Normal   Collection Time   01/23/11 11:30 AM      Component Value Range Comment   WBC 4.6  4.0 - 10.5 (K/uL)    RBC 4.12  3.87 - 5.11 (MIL/uL)    Hemoglobin 12.6  12.0 - 15.0 (g/dL)    HCT 40.9  81.1 - 91.4 (%)    MCV 87.9  78.0 - 100.0 (fL)    MCH 30.6  26.0 - 34.0 (pg)    MCHC 34.8  30.0 - 36.0 (g/dL)    RDW 78.2  95.6 - 21.3 (%)    Platelets 237  150 - 400 (K/uL)   BASIC METABOLIC PANEL     Status: Normal   Collection Time   01/23/11 11:30 AM       Component Value Range Comment   Sodium 139  135 - 145 (mEq/L)    Potassium 4.2  3.5 - 5.1 (mEq/L)    Chloride 104  96 - 112 (mEq/L)    CO2 25  19 - 32 (mEq/L)    Glucose, Bld 98  70 - 99 (mg/dL)    BUN 14  6 - 23 (mg/dL)    Creatinine, Ser 0.86  0.50 - 1.10 (mg/dL)    Calcium 9.5  8.4 - 10.5 (mg/dL)    GFR calc non Af Amer >90  >90 (mL/min)    GFR calc Af Amer >90  >90 (mL/min)   HCG, SERUM, QUALITATIVE     Status: Normal   Collection Time   01/23/11 11:30 AM      Component Value Range Comment   Preg, Serum NEGATIVE  NEGATIVE    SURGICAL PCR SCREEN     Status: Normal   Collection Time   01/23/11 12:25 PM      Component Value Range Comment   MRSA, PCR NEGATIVE  NEGATIVE     Staphylococcus aureus NEGATIVE  NEGATIVE      RADIOLOGY RESULTS: No results found.  Problem List: Patient Active Problem List  Diagnoses  . History of vagotomy  . History of laparoscopic adjustable gastric banding    Assessment & Plan: Patient dissatisfied with lapband and wants to proceed with conversion to Roux Y gastric bypass.  She understands that there are increased risks    Matt B. Daphine Deutscher, MD, [facility] [phone] beeper [phone]  01/25/2011 10:29 AM

## 2011-01-28 ENCOUNTER — Telehealth (INDEPENDENT_AMBULATORY_CARE_PROVIDER_SITE_OTHER): Payer: Self-pay | Admitting: General Surgery

## 2011-01-28 NOTE — Telephone Encounter (Signed)
Having a difficulty keeping bowel prep down.  I recommended that she put it in a water bottle and try to continuously sip it down.  She has a lap band and should only tolerate small amounts anyway.  NPO after midnight for procedure tomorrow

## 2011-01-28 NOTE — Anesthesia Preprocedure Evaluation (Addendum)
Anesthesia Evaluation  Patient identified by MRN, date of birth, ID band Patient awake    Reviewed: Allergy & Precautions, H&P , NPO status , Patient's Chart, lab work & pertinent test results  History of Anesthesia Complications (+) PONV  Airway Mallampati: II TM Distance: >3 FB Neck ROM: full    Dental No notable dental hx. (+) Teeth Intact   Pulmonary neg pulmonary ROS,  clear to auscultation  Pulmonary exam normal       Cardiovascular Exercise Tolerance: Good hypertension, On Medications - pacemakerregular Normal    Neuro/Psych  Headaches, Anxiety Recent vertigo Negative Neurological ROS  Negative Psych ROS   GI/Hepatic negative GI ROS, Neg liver ROS, GERD-  Medicated and Controlled,  Endo/Other  Negative Endocrine ROSMorbid obesity  Renal/GU negative Renal ROS  Genitourinary negative   Musculoskeletal   Abdominal (+) obese,   Peds  Hematology negative hematology ROS (+)   Anesthesia Other Findings   Reproductive/Obstetrics negative OB ROS                         Anesthesia Physical Anesthesia Plan  ASA: II  Anesthesia Plan: General   Post-op Pain Management:    Induction: Intravenous  Airway Management Planned: Oral ETT  Additional Equipment:   Intra-op Plan:   Post-operative Plan: Extubation in OR  Informed Consent: I have reviewed the patients History and Physical, chart, labs and discussed the procedure including the risks, benefits and alternatives for the proposed anesthesia with the patient or authorized representative who has indicated his/her understanding and acceptance.   Dental Advisory Given  Plan Discussed with: CRNA and Surgeon  Anesthesia Plan Comments:        Anesthesia Quick Evaluation

## 2011-01-29 ENCOUNTER — Encounter (HOSPITAL_COMMUNITY)
Admission: RE | Disposition: A | Discharge status: Home / Self Care | Payer: Self-pay | Source: Ambulatory Visit | Attending: Surgery

## 2011-01-29 ENCOUNTER — Encounter (HOSPITAL_COMMUNITY): Payer: Self-pay | Admitting: Anesthesiology

## 2011-01-29 ENCOUNTER — Inpatient Hospital Stay (HOSPITAL_COMMUNITY)
Admission: RE | Admit: 2011-01-29 | Discharge: 2011-02-01 | DRG: 621 | Disposition: A | Discharge status: Home / Self Care | Payer: Managed Care, Other (non HMO) | Source: Ambulatory Visit | Attending: Surgery | Admitting: Surgery

## 2011-01-29 ENCOUNTER — Encounter (HOSPITAL_COMMUNITY): Payer: Self-pay | Admitting: *Deleted

## 2011-01-29 ENCOUNTER — Inpatient Hospital Stay (HOSPITAL_COMMUNITY): Payer: Managed Care, Other (non HMO) | Admitting: Anesthesiology

## 2011-01-29 ENCOUNTER — Encounter (HOSPITAL_COMMUNITY): Payer: Self-pay | Admitting: Surgery

## 2011-01-29 DIAGNOSIS — R51 Headache: Secondary | ICD-10-CM | POA: Diagnosis not present

## 2011-01-29 DIAGNOSIS — Z01812 Encounter for preprocedural laboratory examination: Secondary | ICD-10-CM

## 2011-01-29 DIAGNOSIS — Z87891 Personal history of nicotine dependence: Secondary | ICD-10-CM

## 2011-01-29 DIAGNOSIS — I10 Essential (primary) hypertension: Secondary | ICD-10-CM | POA: Diagnosis present

## 2011-01-29 DIAGNOSIS — E785 Hyperlipidemia, unspecified: Secondary | ICD-10-CM | POA: Diagnosis present

## 2011-01-29 DIAGNOSIS — Z6838 Body mass index (BMI) 38.0-38.9, adult: Secondary | ICD-10-CM

## 2011-01-29 DIAGNOSIS — Z4651 Encounter for fitting and adjustment of gastric lap band: Secondary | ICD-10-CM

## 2011-01-29 DIAGNOSIS — K219 Gastro-esophageal reflux disease without esophagitis: Secondary | ICD-10-CM | POA: Diagnosis present

## 2011-01-29 DIAGNOSIS — Z01810 Encounter for preprocedural cardiovascular examination: Secondary | ICD-10-CM

## 2011-01-29 DIAGNOSIS — Z9884 Bariatric surgery status: Secondary | ICD-10-CM

## 2011-01-29 DIAGNOSIS — J029 Acute pharyngitis, unspecified: Secondary | ICD-10-CM | POA: Diagnosis not present

## 2011-01-29 HISTORY — PX: GASTRIC ROUX-EN-Y: SHX5262

## 2011-01-29 LAB — CREATININE, SERUM
GFR calc Af Amer: >90 mL/min (ref >90)
GFR calc non Af Amer: >90 mL/min (ref >90)

## 2011-01-29 LAB — CBC
Hemoglobin: 11.6 g/dL — ABNORMAL LOW (ref 12.0–15.0)
MCH: 30.3 pg (ref 26.0–34.0)
MCV: 86.9 fL (ref 78.0–100.0)
RBC: 3.83 MIL/uL — ABNORMAL LOW (ref 3.87–5.11)
WBC: 8.2 10*3/uL (ref 4.0–10.5)

## 2011-01-29 SURGERY — LAPAROSCOPIC ROUX-EN-Y GASTRIC
Anesthesia: General | Site: Abdomen | Wound class: Clean Contaminated

## 2011-01-29 MED ORDER — HEPARIN SODIUM (PORCINE) 5000 UNIT/ML IJ SOLN
5000.0000 [IU] | Freq: Three times a day (TID) | INTRAMUSCULAR | Status: DC
  Administered 2011-01-29 – 2011-01-31 (×7): 5000 [IU] via SUBCUTANEOUS
  Filled 2011-01-29 (×11): qty 1

## 2011-01-29 MED ORDER — PROMETHAZINE HCL 25 MG/ML IJ SOLN
6.2500 mg | INTRAMUSCULAR | Status: AC | PRN
  Administered 2011-01-29 (×2): 12.5 mg via INTRAVENOUS

## 2011-01-29 MED ORDER — BUPIVACAINE LIPOSOME 1.3 % IJ SUSP
20.0000 mL | INTRAMUSCULAR | Status: AC
  Administered 2011-01-29: 20 mL
  Filled 2011-01-29: qty 20

## 2011-01-29 MED ORDER — DEXAMETHASONE SODIUM PHOSPHATE 10 MG/ML IJ SOLN
INTRAMUSCULAR | Status: DC | PRN
  Administered 2011-01-29: 10 mg via INTRAVENOUS

## 2011-01-29 MED ORDER — ONDANSETRON HCL 4 MG/2ML IJ SOLN
INTRAMUSCULAR | Status: DC | PRN
  Administered 2011-01-29 (×2): 4 mg via INTRAVENOUS

## 2011-01-29 MED ORDER — LACTATED RINGERS IV SOLN
INTRAVENOUS | Status: DC | PRN
  Administered 2011-01-29 (×3): via INTRAVENOUS

## 2011-01-29 MED ORDER — PROPOFOL 10 MG/ML IV BOLUS
INTRAVENOUS | Status: DC | PRN
  Administered 2011-01-29: 200 mg via INTRAVENOUS

## 2011-01-29 MED ORDER — LACTATED RINGERS IR SOLN
Status: DC | PRN
  Administered 2011-01-29: 3000 mL

## 2011-01-29 MED ORDER — ROCURONIUM BROMIDE 100 MG/10ML IV SOLN
INTRAVENOUS | Status: DC | PRN
  Administered 2011-01-29: 10 mg via INTRAVENOUS
  Administered 2011-01-29: 50 mg via INTRAVENOUS
  Administered 2011-01-29 (×3): 10 mg via INTRAVENOUS

## 2011-01-29 MED ORDER — ACETAMINOPHEN 160 MG/5ML PO SOLN
650.0000 mg | ORAL | Status: DC | PRN
  Administered 2011-01-30: 650 mg via ORAL
  Filled 2011-01-29: qty 20.3

## 2011-01-29 MED ORDER — LIDOCAINE HCL (CARDIAC) 20 MG/ML IV SOLN
INTRAVENOUS | Status: DC | PRN
  Administered 2011-01-29: 60 mg via INTRAVENOUS

## 2011-01-29 MED ORDER — OXYCODONE-ACETAMINOPHEN 5-325 MG/5ML PO SOLN
5.0000 mL | ORAL | Status: DC | PRN
  Administered 2011-02-01: 5 mL via ORAL
  Filled 2011-01-29: qty 5

## 2011-01-29 MED ORDER — TISSEEL VH 10 ML EX KIT
PACK | CUTANEOUS | Status: DC | PRN
  Administered 2011-01-29: 10 mL

## 2011-01-29 MED ORDER — UNJURY VANILLA POWDER: 2.0000 [oz_av] | Freq: Four times a day (QID) | ORAL | Status: DC

## 2011-01-29 MED ORDER — KCL IN DEXTROSE-NACL 20-5-0.45 MEQ/L-%-% IV SOLN
INTRAVENOUS | Status: DC
  Administered 2011-01-29 – 2011-01-31 (×6): via INTRAVENOUS
  Administered 2011-01-31: 1000 mL via INTRAVENOUS
  Administered 2011-02-01: 04:00:00 via INTRAVENOUS
  Filled 2011-01-29 (×11): qty 1000

## 2011-01-29 MED ORDER — UNJURY CHICKEN SOUP POWDER: 2.0000 [oz_av] | Freq: Four times a day (QID) | ORAL | Status: DC

## 2011-01-29 MED ORDER — SCOPOLAMINE 1 MG/3DAYS TD PT72
MEDICATED_PATCH | TRANSDERMAL | Status: DC | PRN
  Administered 2011-01-29: 1 via TRANSDERMAL

## 2011-01-29 MED ORDER — FENTANYL CITRATE 0.05 MG/ML IJ SOLN
INTRAMUSCULAR | Status: DC | PRN
  Administered 2011-01-29: 50 ug via INTRAVENOUS
  Administered 2011-01-29: 100 ug via INTRAVENOUS
  Administered 2011-01-29 (×5): 50 ug via INTRAVENOUS
  Administered 2011-01-29: 100 ug via INTRAVENOUS

## 2011-01-29 MED ORDER — PROMETHAZINE HCL 25 MG/ML IJ SOLN
6.2500 mg | INTRAMUSCULAR | Status: DC | PRN
  Administered 2011-01-29: 12.5 mg via INTRAVENOUS

## 2011-01-29 MED ORDER — SUCCINYLCHOLINE CHLORIDE 20 MG/ML IJ SOLN
INTRAMUSCULAR | Status: DC | PRN
  Administered 2011-01-29: 100 mg via INTRAVENOUS

## 2011-01-29 MED ORDER — ONDANSETRON HCL 4 MG/2ML IJ SOLN: 4.0000 mg | INTRAMUSCULAR | Status: DC | PRN

## 2011-01-29 MED ORDER — MORPHINE SULFATE 2 MG/ML IJ SOLN
2.0000 mg | INTRAMUSCULAR | Status: DC | PRN
  Administered 2011-01-29: 2 mg via INTRAVENOUS
  Administered 2011-01-29 – 2011-01-30 (×3): 4 mg via INTRAVENOUS
  Administered 2011-01-30: 2 mg via INTRAVENOUS
  Administered 2011-01-30: 4 mg via INTRAVENOUS
  Administered 2011-01-30 – 2011-01-31 (×5): 2 mg via INTRAVENOUS
  Filled 2011-01-29: qty 1
  Filled 2011-01-29: qty 2
  Filled 2011-01-29 (×4): qty 1
  Filled 2011-01-29: qty 2
  Filled 2011-01-29 (×2): qty 1
  Filled 2011-01-29 (×2): qty 2

## 2011-01-29 MED ORDER — ACETAMINOPHEN 10 MG/ML IV SOLN
INTRAVENOUS | Status: DC | PRN
  Administered 2011-01-29: 1000 mg via INTRAVENOUS

## 2011-01-29 MED ORDER — HYDROMORPHONE HCL PF 1 MG/ML IJ SOLN: 0.2500 mg | INTRAMUSCULAR | Status: DC | PRN

## 2011-01-29 MED ORDER — DEXTROSE 5 % IV SOLN
1.0000 g | INTRAVENOUS | Status: DC | PRN
  Administered 2011-01-29: 2 g via INTRAVENOUS

## 2011-01-29 MED ORDER — LACTATED RINGERS IV SOLN
INTRAVENOUS | Status: DC
  Administered 2011-01-29: 14:00:00 via INTRAVENOUS

## 2011-01-29 MED ORDER — UNJURY CHOCOLATE CLASSIC POWDER
2.0000 [oz_av] | Freq: Four times a day (QID) | ORAL | Status: DC
  Administered 2011-01-31 (×3): 2 [oz_av] via ORAL

## 2011-01-29 SURGICAL SUPPLY — 66 items
APL SKNCLS STERI-STRIP NONHPOA (GAUZE/BANDAGES/DRESSINGS) ×1
APL SRG 32X5 SNPLK LF DISP (MISCELLANEOUS) ×1
APPLICATOR COTTON TIP 6IN STRL (MISCELLANEOUS) ×3 IMPLANT
BENZOIN TINCTURE PRP APPL 2/3 (GAUZE/BANDAGES/DRESSINGS) ×1 IMPLANT
BLADE SURG 15 STRL LF DISP TIS (BLADE) ×1 IMPLANT
BLADE SURG 15 STRL SS (BLADE) ×2
CABLE HIGH FREQUENCY MONO STRZ (ELECTRODE) ×1 IMPLANT
CANISTER SUCTION 2500CC (MISCELLANEOUS) ×2 IMPLANT
CLIP SUT LAPRA TY ABSORB (SUTURE) ×3 IMPLANT
CLOTH BEACON ORANGE TIMEOUT ST (SAFETY) ×2 IMPLANT
CORD HIGH FREQUENCY UNIPOLAR (ELECTROSURGICAL) ×1 IMPLANT
COVER SURGICAL LIGHT HANDLE (MISCELLANEOUS) ×2 IMPLANT
DEVICE SUTURE ENDOST 10MM (ENDOMECHANICALS) ×2 IMPLANT
DISSECTOR BLUNT TIP ENDO 5MM (MISCELLANEOUS) ×2 IMPLANT
DRAIN PENROSE 18X1/4 LTX STRL (WOUND CARE) ×2 IMPLANT
DRAPE CAMERA CLOSED 9X96 (DRAPES) ×2 IMPLANT
GAUZE SPONGE 4X4 16PLY XRAY LF (GAUZE/BANDAGES/DRESSINGS) ×2 IMPLANT
GLOVE BIOGEL M 8.0 STRL (GLOVE) ×2 IMPLANT
GOWN STRL NON-REIN LRG LVL3 (GOWN DISPOSABLE) ×6 IMPLANT
GOWN STRL REIN XL XLG (GOWN DISPOSABLE) ×4 IMPLANT
HANDLE STAPLE EGIA 4 XL (STAPLE) ×2 IMPLANT
HOVERMATT SINGLE USE (MISCELLANEOUS) ×2 IMPLANT
KIT BASIN OR (CUSTOM PROCEDURE TRAY) ×2 IMPLANT
KIT GASTRIC LAVAGE 34FR ADT (SET/KITS/TRAYS/PACK) ×2 IMPLANT
NEEDLE SPNL 22GX3.5 QUINCKE BK (NEEDLE) ×2 IMPLANT
NS IRRIG 1000ML POUR BTL (IV SOLUTION) ×2 IMPLANT
PACK CARDIOVASCULAR III (CUSTOM PROCEDURE TRAY) ×2 IMPLANT
PEN SKIN MARKING BROAD (MISCELLANEOUS) ×2 IMPLANT
PENCIL BUTTON HOLSTER BLD 10FT (ELECTRODE) ×2 IMPLANT
RELOAD EGIA 45 MED/THCK PURPLE (STAPLE) ×2 IMPLANT
RELOAD EGIA 45 TAN VASC (STAPLE) ×3 IMPLANT
RELOAD EGIA 60 MED/THCK PURPLE (STAPLE) ×8 IMPLANT
RELOAD EGIA 60 TAN VASC (STAPLE) ×2 IMPLANT
RELOAD ENDO STITCH 2.0 (ENDOMECHANICALS) ×18
RELOAD SUT SNGL STCH ABSRB 2-0 (ENDOMECHANICALS) ×5 IMPLANT
RELOAD SUT SNGL STCH BLK 2-0 (ENDOMECHANICALS) ×4 IMPLANT
SCISSORS LAP 5X35 DISP (ENDOMECHANICALS) ×2 IMPLANT
SEALANT SURGICAL APPL DUAL CAN (MISCELLANEOUS) ×2 IMPLANT
SET IRRIG TUBING LAPAROSCOPIC (IRRIGATION / IRRIGATOR) ×2 IMPLANT
SHEARS CURVED HARMONIC AC 45CM (MISCELLANEOUS) ×2 IMPLANT
SLEEVE ADV FIXATION 12X100MM (TROCAR) IMPLANT
SLEEVE ADV FIXATION 5X100MM (TROCAR) ×1 IMPLANT
SLEEVE Z-THREAD 12X100MM (TROCAR) ×2 IMPLANT
SLEEVE Z-THREAD 5X100MM (TROCAR) IMPLANT
SOLUTION ANTI FOG 6CC (MISCELLANEOUS) ×2 IMPLANT
SPONGE GAUZE 4X4 12PLY (GAUZE/BANDAGES/DRESSINGS) ×2 IMPLANT
STAPLER VISISTAT 35W (STAPLE) ×2 IMPLANT
STRIP CLOSURE SKIN 1/2X4 (GAUZE/BANDAGES/DRESSINGS) ×2 IMPLANT
SUT RELOAD ENDO STITCH 2 48X1 (ENDOMECHANICALS) ×5
SUT RELOAD ENDO STITCH 2.0 (ENDOMECHANICALS) ×4
SUT VIC AB 2-0 SH 27 (SUTURE) ×2
SUT VIC AB 2-0 SH 27X BRD (SUTURE) ×1 IMPLANT
SUT VIC AB 4-0 SH 18 (SUTURE) ×2 IMPLANT
SUTURE RELOAD END STTCH 2 48X1 (ENDOMECHANICALS) ×5 IMPLANT
SUTURE RELOAD ENDO STITCH 2.0 (ENDOMECHANICALS) ×4 IMPLANT
SYR 20CC LL (SYRINGE) ×2 IMPLANT
SYR 30ML LL (SYRINGE) ×2 IMPLANT
SYR 50ML LL SCALE MARK (SYRINGE) ×2 IMPLANT
TRAY FOLEY CATH 14FRSI W/METER (CATHETERS) ×2 IMPLANT
TROCAR ADV FIXATION 12X100MM (TROCAR) ×1 IMPLANT
TROCAR XCEL 12X100 BLDLESS (ENDOMECHANICALS) ×2 IMPLANT
TROCAR Z-THREAD FIOS 12X100MM (TROCAR) IMPLANT
TROCAR Z-THREAD FIOS 5X100MM (TROCAR) ×2 IMPLANT
TUBING CONNECTING 10 (TUBING) ×2 IMPLANT
TUBING ENDO SMARTCAP (MISCELLANEOUS) ×2 IMPLANT
TUBING FILTER THERMOFLATOR (ELECTROSURGICAL) ×2 IMPLANT

## 2011-01-29 NOTE — H&P (Signed)
Chief Complaint: I want my band removed. I've been throwing up for 2 years.  History of Present Illness: [name] is an 46 y.o. female who comes in today for a preop visit for her upcoming removal of her 10 cm lap band and conversion to a Roux-en-Y gastric bypass. Her surgery was done in November 06 and she was a vagotomy research patient. She last had intervention our office in February 10 and her weight was down to 227 appropriate wait of 254. She returns today with a weight of 210 and says that she had minimal bleeding thing or drink much for anything for 2 years. I went ahead this time and insisted on attempting to aspirate or bands to make sure nothing was in there. There was in fact 2 cc of fluid in her band. She is side up and ready for conversion to a Roux-en-Y gastric bypass and was to lose another 60 pounds. We will proceed as planned. She has no further questions.  Past Medical History   Diagnosis  Date   .  Migraines    .  GERD (gastroesophageal reflux disease)    .  Hyperlipidemia    .  Hypertension    .  Trouble swallowing    .  Constipation    .  Diarrhea    .  Fatigue    .  Migraines    .  PONV (postoperative nausea and vomiting)  01-23-11     -PONV   .  Vertigo  01-23-11     recent -took Meclicine-now improved   .  Nausea and vomiting  01-23-11     lap band being coverted to Gastric bypass   .  Anxiety  01-23-11     mild    Past Surgical History   Procedure  Date   .  Laparoscopic gastric banding  11/2004   .  Knee cartilage surgery  2004     right knee   .  Tubal ligation    .  Childbirth  01-23-11     NVD    Current Outpatient Prescriptions   Medication  Sig  Dispense  Refill   .  acetaminophen (TYLENOL) 500 MG tablet  Take 1,000 mg by mouth every 6 (six) hours as needed. Pain      Review of patient's allergies indicates no known allergies.  Family History   Problem  Relation  Age of Onset   .  Diabetes  Mother     Social History: reports that she quit smoking  about 3 months ago. She has never used smokeless tobacco. She reports that she does not drink alcohol or use illicit drugs.  REVIEW OF SYSTEMS - PERTINENT POSITIVES ONLY:  noncontributory  Physical Exam:  Blood pressure 116/74, pulse 68, temperature 96.8 F (36 C), temperature source Temporal, resp. rate 20, height 5\' 2"  (1.575 m), weight 210 lb (95.255 kg).  Body mass index is 38.41 kg/(m^2).  Gen: WDWN white female NAD  Neurological: Alert and oriented to person, place, and time. Motor and sensory function is grossly intact  Head: Normocephalic and atraumatic.  Eyes: Conjunctivae are normal. Pupils are equal, round, and reactive to light. No scleral icterus.  Neck: Normal range of motion. Neck supple. No tracheal deviation or thyromegaly present.  Cardiovascular: SR without murmurs or gallops. No carotid bruits  Respiratory: Effort normal. No respiratory distress. No chest wall tenderness. Breath sounds normal. No wheezes, rales or rhonchi.  Abdomen: Very ptotic panniculus  GU:  Musculoskeletal: Normal range of motion. Extremities are nontender. No cyanosis, edema or clubbing noted Lymphadenopathy: No cervical, preauricular, postauricular or axillary adenopathy is present Skin: Skin is warm and dry. No rash noted. No diaphoresis. No erythema. No pallor. Pscyh: Normal mood and affect. Behavior is normal. Judgment and thought content normal.  LABORATORY RESULTS:  Results for orders placed during the hospital encounter of 01/23/11 (from the past 48 hour(s))   CBC Status: Normal    Collection Time    01/23/11 11:30 AM   Component  Value  Range  Comment    WBC  4.6  4.0 - 10.5 (K/uL)     RBC  4.12  3.87 - 5.11 (MIL/uL)     Hemoglobin  12.6  12.0 - 15.0 (g/dL)     HCT  78.2  95.6 - 46.0 (%)     MCV  87.9  78.0 - 100.0 (fL)     MCH  30.6  26.0 - 34.0 (pg)     MCHC  34.8  30.0 - 36.0 (g/dL)     RDW  21.3  08.6 - 15.5 (%)     Platelets  237  150 - 400 (K/uL)    BASIC METABOLIC PANEL Status:  Normal    Collection Time    01/23/11 11:30 AM   Component  Value  Range  Comment    Sodium  139  135 - 145 (mEq/L)     Potassium  4.2  3.5 - 5.1 (mEq/L)     Chloride  104  96 - 112 (mEq/L)     CO2  25  19 - 32 (mEq/L)     Glucose, Bld  98  70 - 99 (mg/dL)     BUN  14  6 - 23 (mg/dL)     Creatinine, Ser  5.78  0.50 - 1.10 (mg/dL)     Calcium  9.5  8.4 - 10.5 (mg/dL)     GFR calc non Af Amer  >90  >90 (mL/min)     GFR calc Af Amer  >90  >90 (mL/min)    HCG, SERUM, QUALITATIVE Status: Normal    Collection Time    01/23/11 11:30 AM   Component  Value  Range  Comment    Preg, Serum  NEGATIVE  NEGATIVE    SURGICAL PCR SCREEN Status: Normal    Collection Time    01/23/11 12:25 PM   Component  Value  Range  Comment    MRSA, PCR  NEGATIVE  NEGATIVE     Staphylococcus aureus  NEGATIVE  NEGATIVE     RADIOLOGY RESULTS:  No results found.  Problem List:  Patient Active Problem List   Diagnoses   .  History of vagotomy   .  History of laparoscopic adjustable gastric banding    Assessment & Plan:  Patient dissatisfied with lapband and wants to proceed with conversion to Roux Y gastric bypass. She understands that there are increased risks  Matt B. Daphine Deutscher, MD, FACS  There has been no change in the patient's past medical history or physical exam in the past 24 hours to the best of my knowledge.  I discussed specifically how it felt since I removed the fluid from her band on her last visit.  She expressed her desire to have her band removed and a gastric bypass performed.   Expectations and outcome results have been discussed with the patient to include risks and benefits.  All questions have been answered and will proceed with  previously discussed procedure noted and signed in the consent form in the patient's record.    [name] @NOW  01/29/2011

## 2011-01-29 NOTE — Anesthesia Postprocedure Evaluation (Signed)
  Anesthesia Post-op Note  Patient: [name]  Procedure(s) Performed:  LAPAROSCOPIC ROUX-EN-Y GASTRIC - Removal of Lap Band Conversion to Roux Y Gastric Bypass ; LAPAROSCOPIC REPAIR AND REMOVAL OF GASRIC BAND - Removal Lap Band Conversion to Gastric Bypass   Patient Location: PACU  Anesthesia Type: General  Level of Consciousness: awake and alert   Airway and Oxygen Therapy: Patient Spontanous Breathing  Post-op Pain: mild  Post-op Assessment: Post-op Vital signs reviewed, Patient's Cardiovascular Status Stable, Respiratory Function Stable, Patent Airway and No signs of Nausea or vomiting  Post-op Vital Signs: stable  Complications: No apparent anesthesia complications

## 2011-01-29 NOTE — Transfer of Care (Signed)
Immediate Anesthesia Transfer of Care Note  Patient: [name]  Procedure(s) Performed:  LAPAROSCOPIC ROUX-EN-Y GASTRIC - Removal of Lap Band Conversion to Roux Y Gastric Bypass ; LAPAROSCOPIC REPAIR AND REMOVAL OF GASRIC BAND - Removal Lap Band Conversion to Gastric Bypass   Patient Location: PACU  Anesthesia Type: General  Level of Consciousness: awake and alert   Airway & Oxygen Therapy: Patient Spontanous Breathing and Patient connected to face mask oxygen  Post-op Assessment: Report given to PACU RN and Post -op Vital signs reviewed and stable  Post vital signs: Reviewed and stable Filed Vitals:   01/29/11 0518  BP: 134/85  Pulse: 77  Temp: 36.1 C  Resp: 20    Complications: No apparent anesthesia complications

## 2011-01-29 NOTE — Progress Notes (Signed)
Pt states she fell down steps a couple weeks ago and landed on her "buttocks".  She c/o of her "tale bone" hurting and unable to sit without her "buttocks" hurting.

## 2011-01-29 NOTE — Anesthesia Procedure Notes (Signed)
Procedure Name: Intubation Date/Time: 01/29/2011 7:39 AM Performed by: [place], Mithcell Schumpert C Pre-anesthesia Checklist: Patient identified, Timeout performed, Emergency Drugs available, Suction available and Patient being monitored Patient Re-evaluated:Patient Re-evaluated prior to inductionOxygen Delivery Method: Circle System Utilized Preoxygenation: Pre-oxygenation with 100% oxygen Intubation Type: IV induction Ventilation: Mask ventilation without difficulty Laryngoscope Size: Mac and 3 Grade View: Grade I Tube type: Oral Tube size: 7.5 mm Number of attempts: 1 Airway Equipment and Method: stylet Placement Confirmation: ETT inserted through vocal cords under direct vision,  breath sounds checked- equal and bilateral,  positive ETCO2 and CO2 detector Secured at: 21 cm Tube secured with: Tape Dental Injury: Teeth and Oropharynx as per pre-operative assessment

## 2011-01-29 NOTE — Op Note (Signed)
Surgeon: Pollyann Savoy. Daphine Deutscher, MD, FACS Asst:  Sandria Bales. Ezzard Standing, MD, FACS Anesthesia: General endotracheal Drains: None Dx  Prior 10 cm Lapband and vagotomy Procedure: Laparoscopic takedown of Lapband with removel;  Roux en Y gastric bypass with 40 cm BP limb and 100 cm Roux limb, antecolic, antegastric, candy cane to the left.  Closure of Peterson's defect. Upper endoscopy by Dr. Ezzard Standing   Description of Procedure:  The patient was taken to OR 1 at [facility] and given general anesthesia.  The abdomen was prepped with PCMX and draped sterilely.  A time out was performed.  The abdomen was entered through the left upper quadrant without difficulty.  The 6 ports (standard) were placed including the [facility] in the upper midline.  Foregut dissection and removal of the 10 cm lapband was accomplished with out difficuly and the band was removed.  The port was subsequently removed at the end of the case.  The removal of the lapband and the takedown of the plication was accomplished without difficulty.    The second portion of the operation began by identifying the ligament of Treitz. I measured 40 cm downstream and divided the bowel with a 6 cm Covidian stapler.  I sutured a Penrose drain along the Roux limb end.  I measured a 1 meter (100 cm) Roux limb and then placed the distal bowels to the BP limb side by side and performed a stapled jejunojejunostomy. The common defect was closed from either end with 4-0 Vicryl using the Endo Stitch. The mesenteric defect was closed with a running 2-0 silk using the Endo Stitch. Tisseel was applied to the suture line.  The omentum was divided with the harmonic scalpel.  The roux was mobile.    The Roux limb was then brought up with the candycane pointed left and a back row of sutures of 2-0 Vicryl were placed. I opened along the right side of each structure and inserted the 4.5 cm stapler to create the gastrojejunostomy. The common defect was closed from either end with 2-0 Vicryl and a  second row was placed anterior to that the Ewald tube acting as a stent across the anastomosis. The Penrose drain was removed. Peterson's defect was closed with 2-0 silk.   Endoscopy was performed by Ovidio Kin.  No external bubbles were noted and there was no bleeding and the pouch was about 6 cm.  The port was removed through the lower right trocar site and the defect  Closed with a 0 vicryl.    The incisions were injected with Exparel and were closed with 4-0 Vicryl and steri strips  The patient was taken to the recovery room in satisfactory condition.  Matt B. Daphine Deutscher, MD, FACS

## 2011-01-30 ENCOUNTER — Inpatient Hospital Stay (HOSPITAL_COMMUNITY): Payer: Managed Care, Other (non HMO)

## 2011-01-30 DIAGNOSIS — Z48812 Encounter for surgical aftercare following surgery on the circulatory system: Secondary | ICD-10-CM

## 2011-01-30 LAB — DIFFERENTIAL
Lymphs Abs: 0.9 10*3/uL (ref 0.7–4.0)
Monocytes Relative: 8 % (ref 3–12)
Neutro Abs: 5.7 10*3/uL (ref 1.7–7.7)
Neutrophils Relative %: 79 % — ABNORMAL HIGH (ref 43–77)

## 2011-01-30 LAB — CBC
Hemoglobin: 11.6 g/dL — ABNORMAL LOW (ref 12.0–15.0)
Platelets: 172 10*3/uL (ref 150–400)
RBC: 3.74 MIL/uL — ABNORMAL LOW (ref 3.87–5.11)
WBC: 7.2 10*3/uL (ref 4.0–10.5)

## 2011-01-30 LAB — HEMOGLOBIN AND HEMATOCRIT, BLOOD: Hemoglobin: 11.3 g/dL — ABNORMAL LOW (ref 12.0–15.0)

## 2011-01-30 MED ORDER — IOHEXOL 300 MG/ML  SOLN
50.0000 mL | Freq: Once | INTRAMUSCULAR | Status: AC | PRN
  Administered 2011-01-30: 50 mL via ORAL

## 2011-01-30 NOTE — Progress Notes (Signed)
VASCULAR LAB PRELIMINARY  PRELIMINARY  PRELIMINARY  PRELIMINARY  Bilateral lower extremity venous duplex  completed.    Preliminary report:  Bilateral:  No evidence of DVT, superficial thrombosis, or Baker's Cyst.    Terance Hart, RVT 01/30/2011, 9:10 AM

## 2011-01-30 NOTE — Progress Notes (Signed)
Pt resting in bed; alert and oriented; VSS; pt c/o mild headache she states she feels is from not eating; c/o nausea earlier but that has subsided; remains NPO; awaiting swallow study; Doppler-negative; verbalized abdominal pain relieved by prn pain meds; ambulating to BR; voiding without difficulty; denies passing flatus; encouraged ambulation in hallways today and instructed on use of incentive spirometer and pt verbalized understanding of; plan of care reviewed with pt and pt verbalized understanding of; discharge instructions given for pt to review and will complete tomorrow.  GASTRIC BYPASS/SLEEVE DISCHARGE INSTRUCTIONS  Drs. Fredrik Rigger, Hoxworth, Wilson, and [place] Call if you have any problems.   Call [phone] and ask for the surgeon on call.    If you need immediate assistance come to the ER at [facility]. Tell the ER personnel that you are a new post-op gastric bypass patient. Signs and symptoms to report:   Severe vomiting or nausea. If you cannot tolerate clear liquids for longer than 1 day, you need to call your surgeon.    Abdominal pain which does not get better after taking your pain medication   Fever greater than 101 F degree   Difficulty breathing   Chest pain    Redness, swelling, drainage, or foul odor at incision sites    If your incisions open or pull apart   Swelling or pain in calf (lower leg)   Diarrhea, frequent watery, uncontrolled bowel movements.   Constipation, (no bowel movements for 3 days) if this occurs, Take Milk of Magnesia, 2 tablespoons by mouth, 3 times a day for 2 days if needed.  Call your doctor if constipation continues. Stop taking Milk of Magnesia once you have had a bowel movement. You may also use Miralax according to the label instructions.   Anything you consider "abnormal for you".   Normal side effects after Surgery:   Unable to sleep at night or concentrate   Irritability   Being tearful (crying) or depressed   These are common  complaints, possibly related to your anesthesia, stress of surgery and change in lifestyle, that usually go away a few weeks after surgery.  If these feelings continue, call your medical doctor.  Wound Care You may have surgical glue, steri-strips, or staples over your incisions after surgery.  Surgical glue:  Looks like a clear film over your incisions and will wear off gradually. Steri-strips: Strips of tape over your incisions. You may notice a yellowish color on the skin underneath the steri-strips. This is a substance used to make the steri-strips stick better. Do not pull the steri-strips off - let them fall off.  Staples: Cherlynn Polo may be removed before you leave the hospital. If you go home with staples, call [facility] [phone]) for an appointment with your surgeon's nurse to have staples removed in 7 - 10 days. Showering: You may shower two days after your surgery unless otherwise instructed by your surgeon. Wash gently around wounds with warm soapy water, rinse well, and gently pat dry.  If you have a drain, you may need someone to hold this while you shower. Avoid tub baths until staples are removed and incisions are healed.    Medications   Medications should be liquid or crushed if larger than the size of a dime.  Extended release pills should not be crushed.   Depending on the size and number of medications you take, you may need to stagger/change the time you take your medications so that you do not over-fill your  pouch.    Make sure you follow-up with your primary care physician to make medication adjustments needed during rapid weight loss and life-style adjustment.   If you are diabetic, follow up with the doctor that prescribes your diabetes medication(s) within one week after surgery and check your blood sugar regularly.   Do not drive while taking narcotics!   Do not take acetaminophen (Tylenol) and Roxicet or Lortab Elixir at the same time since these pain  medications contain acetaminophen.  Diet at home: (First 2 Weeks) You will see the nutritionist two weeks after your surgery. She will advance your diet if you are tolerating liquids well. Once at home, if you have severe vomiting or nausea and cannot tolerate clear liquids lasting longer than 1 day, call your surgeon.  Begin high protein shake 2 ounces every 3 hours, 5 - 6 times per day.  Gradually increase the amount you drink as tolerated.  You may find it easier to slowly sip shakes throughout the day.  It is important to get your proteins in first.   Protein Shake   Drink at least 2 ounces of shake 5-6 times per day   Each serving of protein shakes should have a minimum of 15 grams of protein and no more than 5 grams of carbohydrate    Increase the amount of protein shake you drink as tolerated   Protein powder may be added to fluids such as non-fat milk or Lactaid milk (limit to 20 grams added protein powder per serving   The initial goal is to drink at least 8 ounces of protein shake/drink per day (or as directed by the nutritionist). Some examples of protein shakes are [employer], [employer]illard[employer], EAS Edge HP, and Unjury. Hydration   Gradually increase the amount of water and other liquids as tolerated (See Acceptable Fluids)   Gradually increase the amount of protein shake as tolerated     Sip fluids slowly and throughout the day   May use Sugar substitutes, use sparingly (limit to 6 - 8 packets per day). Your fluid goal is 64 ounces of fluid daily. It may take a few weeks to build up to this.         32 oz (or more) should be clear liquids and 32 oz (or more) should be full liquids.         Liquids should not contain sugar, caffeine, or carbonation! Acceptable Fluids Clear Liquids:   Water or Sugar-free flavored water, Fruit H2O   Decaffeinated coffee or tea (sugar-free)   Crystal Lite, Wyler's Lite, Minute Maid Lite   Sugar-free Jell-O   Bouillon or broth   Sugar-free  Popsicle:   *Less than 20 calories each; Limit 1 per day   Full Liquids:              Protein Shakes/Drinks + 2 choices per day of other full liquids shown below.    Other full liquids must be: No more than 12 grams of Carbs per serving,  No more than 3 grams of Fat per serving   Strained low-fat cream soup   Non-Fat milk   Fat-free Lactaid Milk   Sugar-free yogurt (Dannon Lite & Fit) Vitamins and Minerals (Start 1 day after surgery unless otherwise directed)   2 Chewable Multivitamin / Multimineral Supplement (i.e. Centrum for Adults)   Chewable Calcium Citrate with Vitamin D-3. Take 1500 mg each day.           (Example: 3 Chewable Calcium Plus  600 with Vitamin D-3 can be found at [facility])         Vitamin B-12, 350 - 500 micrograms (oral tablet) each day   Do not mix multivitamins containing iron with calcium supplements; take 2 hours   apart   Do not substitute Tums (calcium carbonate) for your calcium   Menstruating women and those at risk for anemia may need extra iron. Talk with your doctor to see if you need additional iron.    If you need extra iron:  Total daily Iron recommendations (including Vitamins) = 50 - 100 mg Iron/day Do not stop taking or change any vitamins or minerals until you talk to your nutritionist or surgeon. Your nutritionist and / or physician must approve all vitamin and mineral supplements. Exercise For maximum success, begin exercising as soon as your doctor recommends. Make sure your physician approves any physical activity.   Depending on fitness level, begin with a simple walking program   Walk 5-15 minutes each day, 7 days per week.    Slowly increase until you are walking 30-45 minutes per day   Consider joining our BELT program. [phone] or email belt@[employer] .edu Things to remember:    You may have sexual relations when you feel comfortable. It is VERY important for female patients to use a reliable birth control method. Fertility often increases after  surgery. Do not get pregnant for at least 18 months.   It is very important to keep all follow up appointments with your surgeon, nutritionist, primary care physician, and behavioral health practitioner. After the first year, please follow up with your bariatric surgeon at least once a year in order to maintain best weight loss results.  [facility]: [phone] [facility]: [phone]   Free counseling is available for you and your family through collaboration between [facility] and [place]. Please call [phone] and leave a message.    Consider purchasing a medical alert bracelet that says you had gastric bypass surgery.    [facility] has a free Bariatric Surgery Support Group that meets monthly, the 3rd Thursday, 6 pm, Classroom #1, [place]. You may register online at [email or web address], but registration is not necessary. Select Classes and Support Groups, Bariatric Surgery, or Call [phone]   Do not return to work or drive until cleared by your surgeon   Use your CPAP when sleeping if applicable   Do not lift anything greater than ten pounds for at least two weeks  Talmadge Chad, RN Bariatric Nurse Coordinator

## 2011-01-30 NOTE — Op Note (Signed)
[name], [name]                ACCOUNT NO.:  [id number]  MEDICAL RECORD NO.:  [id number]  LOCATION:  1533                         FACILITY:  [facility]  PHYSICIAN:  Sandria Bales. Ezzard Standing, M.D.  DATE OF BIRTH:  [birth year]  DATE OF PROCEDURE:  01/29/2011                               OPERATIVE REPORT  PREOPERATIVE DIAGNOSIS:  Morbid obesity, status post Roux-en-Y gastric bypass.  POSTOPERATIVE DIAGNOSIS:  Morbid obesity, status post Roux-en-Y gastric bypass.  PROCEDURE:  Esophagogastrojejunoscopy.  SURGEON:  Sandria Bales. Ezzard Standing, MD  FIRST ASSISTANT:  None.  ANESTHESIA:  General endotracheal.  ESTIMATED BLOOD LOSS:  None.  INDICATION FOR PROCEDURE:  [name] is a 46 year old female who is undergoing a conversion from a laparoscopic band to a Roux-en-Y gastric Bypass by Dr. Sheron Nightingale.  I am doing upper endoscopy to evaluate the gastric pouch and anastomosis post Roux-en-Y gastric bypass.  OPERATIVE NOTE:  The patient in supine position.  The patient still has laparoscope in her abdominal cavity with Dr. Daphine Deutscher operating this.  I passed a flexible Olympus endoscope down the back of her throat into her esophagus and stomach pouch.  I identified the gastrojejunal anastomosis at 46-47 cm.  Her pouch was viable with no evidence of any bleeding.  I insufflated the pouch while Dr. Daphine Deutscher clamped off the jejunum, there was no evidence of air leak or anastomotic leak.  The patient did have a little bit more generous greater curvature.  I think this is the anatomy with the staple line and the previous band, her esophagogastric junction was at 39 cm for about a 7-8 cm pouch.  The esophagus was otherwise normal.  The scope was then withdrawn.  Dr. Daphine Deutscher will dictate the remainder of the Roux-en-Y gastric bypass.   Sandria Bales. Ezzard Standing, M.D., FACS   DHN/MEDQ  D:  01/29/2011  T:  01/30/2011  Job:  [id number]  cc:   [place] Daphine Deutscher, MD [address], [address] [place] [address]

## 2011-01-30 NOTE — Progress Notes (Signed)
Patient ID: [name], female   DOB: February 05, 1965, 46 y.o.   MRN: [id number] [facility] Progress Note:   1 Day Post-Op  Subjective: [place] where port site was removed Objective: Vital signs in last 24 hours: Temp:  [97.4 F (36.3 C)-99.5 F (37.5 C)] 99.5 F (37.5 C) (01/15 0600) Pulse Rate:  [58-82] 82  (01/15 0600) Resp:  [12-24] 18  (01/15 0600) BP: (98-143)/(58-81) 112/68 mmHg (01/15 0600) SpO2:  [92 %-100 %] 92 % (01/15 0600) Weight:  [221 lb 12.5 oz (100.6 kg)] 221 lb 12.5 oz (100.6 kg) (01/14 1730)  Intake/Output from previous day: 01/14 0701 - 01/15 0700 In: 4500 [I.V.:4500] Out: 1600 [Urine:1600] Intake/Output this shift:    Physical Exam:  Sore as above.  UGI pending Lab Results:   Basename 01/30/11 0353 01/29/11 2101  WBC 7.2 8.2  HGB 11.6* 11.6*  HCT 33.1* 33.3*  PLT 172 198   BMET  Basename 01/29/11 1820  NA --  K --  CL --  CO2 --  GLUCOSE --  BUN --  CREATININE 0.74  CALCIUM --   PT/INR No results found for this basename: LABPROT:2,INR:2 in the last 72 hours Studies/Results: No results found. Anti-infectives: Anti-infectives    None      Assessment/Plan: Problem List: Patient Active Problem List  Diagnoses  . History of vagotomy  . History of laparoscopic adjustable gastric banding    Awaiting UGI before starting liquids.  Labs and VS OK 1 Day Post-Op    LOS: 1 day   Matt B. Daphine Deutscher, MD, [facility] [phone] beeper [phone]  01/30/2011 8:02 AM

## 2011-01-31 ENCOUNTER — Inpatient Hospital Stay (HOSPITAL_COMMUNITY): Payer: Managed Care, Other (non HMO)

## 2011-01-31 LAB — CBC
Hemoglobin: 10.3 g/dL — ABNORMAL LOW (ref 12.0–15.0)
MCH: 30.3 pg (ref 26.0–34.0)
MCV: 89.7 fL (ref 78.0–100.0)
Platelets: 130 10*3/uL — ABNORMAL LOW (ref 150–400)
RBC: 3.4 MIL/uL — ABNORMAL LOW (ref 3.87–5.11)
WBC: 4.9 10*3/uL (ref 4.0–10.5)

## 2011-01-31 LAB — DIFFERENTIAL
Eosinophils Absolute: 0.2 10*3/uL (ref 0.0–0.7)
Lymphocytes Relative: 23 % (ref 12–46)
Lymphs Abs: 1.1 10*3/uL (ref 0.7–4.0)
Monocytes Relative: 8 % (ref 3–12)
Neutrophils Relative %: 65 % (ref 43–77)

## 2011-01-31 MED ORDER — ACETAMINOPHEN 10 MG/ML IV SOLN
1000.0000 mg | Freq: Four times a day (QID) | INTRAVENOUS | Status: AC
  Administered 2011-01-31 – 2011-02-01 (×4): 1000 mg via INTRAVENOUS
  Filled 2011-01-31 (×4): qty 100

## 2011-01-31 NOTE — Progress Notes (Signed)
Patient ID: [name], female   DOB: 02/21/1965, 46 y.o.   MRN: [id number] [facility] Progress Note:   2 Days Post-Op  Subjective: Complaining of headache, sore throat and swollen submandibular glands Objective: Vital signs in last 24 hours: Temp:  [97.6 F (36.4 C)-99.3 F (37.4 C)] 99.3 F (37.4 C) (01/16 0605) Pulse Rate:  [62-73] 73  (01/16 0605) Resp:  [18-20] 18  (01/16 0605) BP: (93-115)/(58-78) 103/70 mmHg (01/16 0605) SpO2:  [96 %-97 %] 97 % (01/16 0605)  Intake/Output from previous day: 2022-02-28 0701 - 01/16 0700 In: 1710 [P.O.:60; I.V.:1650] Out: 3450 [Urine:3450] Intake/Output this shift:    Physical Exam:  No cervical crepitus.  Palpable submandibular glands are not tender.   Lab Results:   Basename 01/31/11 0403 2011-03-01 1753 03-01-11 0353  WBC 4.9 -- 7.2  HGB 10.3* 11.3* --  HCT 30.5* 32.4* --  PLT 130* -- 172   BMET  Basename 01/29/11 1820  NA --  K --  CL --  CO2 --  GLUCOSE --  BUN --  CREATININE 0.74  CALCIUM --   PT/INR No results found for this basename: LABPROT:2,INR:2 in the last 72 hours Studies/Results: Dg Abd 2 Views  2011/03/01  *RADIOLOGY REPORT*  Clinical Data: Follow up abnormal upper GI exam with small bowel dilatation, prior gastrojejunostomy  ABDOMEN - 2 VIEW  Comparison: Upper GI exam Mar 01, 2011  Findings: Previously administered oral contrast is present throughout the colon. Colon appears nondistended. Paucity of small bowel gas. No definite wall thickening or evidence of obstruction.  IMPRESSION: No definite evidence of bowel obstruction, with previously administered contrast material from upper GI exam now all located within the colon.  Original Report Authenticated By: Lollie Marrow, M.D.   Dg Kayleen Memos W/water Sol Cm  03/01/2011  *RADIOLOGY REPORT*  Clinical Data: Postop gastric bypass.  ESOPHAGUS WITH WATER SOLUTION CONTRAST  Comparison: 12/06/2004.  Findings: Interval removal of the previously seen gastric banding  device.  Small gastric pouch is noted.  There is significant delay in emptying of the gastric pouch into the jejunum. After a long period of time (20-30 minutes), only approximately the first 10- 15 cm of proximal small bowel has been opacified.  There is a dilated appearance of this portion of the small bowel with a beaked appearance.  The patient was allowed to walk around the radiology department for some time. Repeat imaging at this time does show passage of contrast beyond this dilated loop into decompressed small bowel.  However, the dilated, beaked appearance of the proximal small bowel persists with very slow emptying.  No contrast extravasation or leak identified.  IMPRESSION: Caliber change within the proximal small bowel.  Approximately 10- 15 cm downstream of the gastrojejunostomy, the small bowel is dilated with a beaked appearance.  This empties very slowly into decompressed small bowel distally.  Cannot exclude some form of stricture or compression on the bowel at this level. No contrast extravasation.  These results were called to Dr. Daphine Deutscher at the time of interpretation.  Original Report Authenticated By: Cyndie Chime, M.D.   Anti-infectives: Anti-infectives    None      Assessment/Plan: Problem List: Patient Active Problem List  Diagnoses  . History of vagotomy  . History of laparoscopic adjustable gastric banding    Will get xray of neck for evidence of soft tissue trauma after Ewald tube insertion during surgery.  Advance to PD 2 diet.  Doubt will be ready for discharge today 2 Days  Post-Op    LOS: 2 days   Matt B. Daphine Deutscher, MD, [facility] [phone] beeper [phone]  01/31/2011 8:40 AM

## 2011-01-31 NOTE — Progress Notes (Signed)
CARE MANAGEMENT NOTE 01/31/2011  Patient:  [name], [name]   Account Number:  [id number]  Date Initiated:  01/31/2011  Documentation initiated by:  Lizbett Garciagarcia  Subjective/Objective Assessment:   46 yo female admitted with morbid obesity S/P gastric bypass     Action/Plan:   D/C when medically stable   Anticipated DC Date:  02/03/2011   Anticipated DC Plan:  HOME/SELF CARE      DC Planning Services  CM consult                Status of service:  Completed, signed off  Comments:  01/31/11, Kathi Der, RNC-MNN, BSN, [phone], CM received DME referral X2.  DME referral is actually a nursing order to begin PD 1 & 2 Bariatric diet.  Called pt's nurse to notify her of orders.

## 2011-01-31 NOTE — Progress Notes (Signed)
Pt alert and oriented; Temp max 99.8 other vital signs stable; pt c/o aching headache but much better than yesterday; c/o neck and "glands being swollen" but non tender; denies any difficulty swallowing or sore throat; tolerating water well; will begin protein shakes today per MD order; denies nausea or vomiting; voiding without difficulty; ambulating well in hallways; verbalized relief with prn pain meds; follow up from abnormal upper GI exam shows no evidence of bowel obstruction; neck xray normal; encouraged ambulation and pulmonary toilet and pt verbalized understanding of. Talmadge Chad, RN Bariatric Nurse Coordinator

## 2011-02-01 DIAGNOSIS — Z9884 Bariatric surgery status: Secondary | ICD-10-CM

## 2011-02-01 MED ORDER — OXYCODONE-ACETAMINOPHEN 5-325 MG/5ML PO SOLN: 5.0000 mL | ORAL | Status: AC | PRN

## 2011-02-01 NOTE — Discharge Summary (Signed)
Physician Discharge Summary  Patient ID: [name] MRN: [id number] DOB/AGE: [birth year] 46 y.o.  Admit date: 01/29/2011 Discharge date: 02/01/2011  Admission Diagnoses:  Discharge Diagnoses:  Active Problems:  S/P removal of lapband and lap Roux en Y Gastric Bypass Jan 2013   Discharged Condition: good  Hospital Course: had above surgery.  UGI looked OK after brief delay on PD 1.  Ready for discharge on PD 3  Consults: none  Significant Diagnostic Studies: radiology: UGI  Treatments: surgery: conversion to bypass  Discharge Exam: Blood pressure 116/85, pulse 60, temperature 98.4 F (36.9 C), temperature source Oral, resp. rate 18, height 5\' 2"  (1.575 m), weight 221 lb 12.5 oz (100.6 kg), SpO2 99.00%. Incision/Wound:OK  Disposition: Home or Self Care  Discharge Orders    Future Appointments: Provider: Department: Dept Phone: Center:   02/13/2011 4:00 PM [facility] [phone] NDM   02/15/2011 9:10 AM Valarie Merino, MD [facility] [phone] None     Future Orders Please Complete By Expires   Diet Carb Modified      Increase activity slowly        Medication List  As of 02/01/2011  7:49 AM   TAKE these medications         acetaminophen 500 MG tablet   Commonly known as: TYLENOL   Take 1,000 mg by mouth every 6 (six) hours as needed. Pain        oxyCODONE-acetaminophen 5-325 MG/5ML solution   Commonly known as: ROXICET   Take 5-10 mLs by mouth every 4 (four) hours as needed.           Follow-up Information    Schedule an appointment as soon as possible for a visit with Valarie Merino, MD.   Contact information:   [facility] [address], [address] [place] [address] [phone]          Signed: Valarie Merino 02/01/2011, 7:49 AM

## 2011-02-01 NOTE — Progress Notes (Signed)
Pt discharged after seeing MD; phone follow up; pt alert and oriented; VSS while in hospital; pt states she is feeling much better today; headache improved; neck glands not as swollen; tolerating water and protein shakes; denies nausea or vomiting; voiding without difficulty; ambulating without difficulty; pt verbalized understanding of discharge instructions; pt already has follow up appts with [facility] and [facility].  Talmadge Chad, RN Bariatric Nurse Coordinator

## 2011-02-01 NOTE — Progress Notes (Signed)
D/C instructions reviewed w/ pt and husband. Both verbalize understanding, all questions answered, no further questions. Pt d/c in w/c in stable condition by NT. Pt in possession of d/c instructions, script, and all personal belongings.

## 2011-02-01 NOTE — Progress Notes (Signed)
Patient ID: [name], female   DOB: [birth year], 46 y.o.   MRN: [id number] [facility] Progress Note:   3 Days Post-Op  Subjective: Headache gone.  Sore but appropriate Objective: Vital signs in last 24 hours: Temp:  [98.2 F (36.8 C)-99.8 F (37.7 C)] 98.4 F (36.9 C) (01/17 0445) Pulse Rate:  [60-68] 60  (01/17 0445) Resp:  [18-20] 18  (01/17 0445) BP: (98-138)/(62-91) 116/85 mmHg (01/17 0445) SpO2:  [97 %-99 %] 99 % (01/17 0445)  Intake/Output from previous day: 01/16 0701 - 01/17 0700 In: 4060 [P.O.:60; I.V.:3600; IV Piggyback:400] Out: 5850 [Urine:5850] Intake/Output this shift:    Physical Exam:  steristrips in place.  Dressings removed and incisions look ok Lab Results:   Basename 01/31/11 0403 01/30/11 1753 01/30/11 0353  WBC 4.9 -- 7.2  HGB 10.3* 11.3* --  HCT 30.5* 32.4* --  PLT 130* -- 172   BMET  Basename 01/29/11 1820  NA --  K --  CL --  CO2 --  GLUCOSE --  BUN --  CREATININE 0.74  CALCIUM --   PT/INR No results found for this basename: LABPROT:2,INR:2 in the last 72 hours Studies/Results: Dg Neck Soft Tissue  01/31/2011  *RADIOLOGY REPORT*  Clinical Data: Sore throat for 1 day.  NECK SOFT TISSUES - 1+ VIEW  Comparison: None.  Findings: Prevertebral soft tissues appear normal.  The epiglottis and aryepiglottic folds are unremarkable.  The airway is patent. There is no focal bony abnormality with some degenerative disease in the mid cervical spine identified.  IMPRESSION: No acute finding.  Original Report Authenticated By: Bernadene Bell. D'ALESSIO, M.D.   Dg Abd 2 Views  01/30/2011  *RADIOLOGY REPORT*  Clinical Data: Follow up abnormal upper GI exam with small bowel dilatation, prior gastrojejunostomy  ABDOMEN - 2 VIEW  Comparison: Upper GI exam 01/30/2011  Findings: Previously administered oral contrast is present throughout the colon. Colon appears nondistended. Paucity of small bowel gas. No definite wall thickening or evidence of  obstruction.  IMPRESSION: No definite evidence of bowel obstruction, with previously administered contrast material from upper GI exam now all located within the colon.  Original Report Authenticated By: Lollie Marrow, M.D.   Dg Kayleen Memos W/water Sol Cm  01/30/2011  *RADIOLOGY REPORT*  Clinical Data: Postop gastric bypass.  ESOPHAGUS WITH WATER SOLUTION CONTRAST  Comparison: 12/06/2004.  Findings: Interval removal of the previously seen gastric banding device.  Small gastric pouch is noted.  There is significant delay in emptying of the gastric pouch into the jejunum. After a long period of time (20-30 minutes), only approximately the first 10- 15 cm of proximal small bowel has been opacified.  There is a dilated appearance of this portion of the small bowel with a beaked appearance.  The patient was allowed to walk around the radiology department for some time. Repeat imaging at this time does show passage of contrast beyond this dilated loop into decompressed small bowel.  However, the dilated, beaked appearance of the proximal small bowel persists with very slow emptying.  No contrast extravasation or leak identified.  IMPRESSION: Caliber change within the proximal small bowel.  Approximately 10- 15 cm downstream of the gastrojejunostomy, the small bowel is dilated with a beaked appearance.  This empties very slowly into decompressed small bowel distally.  Cannot exclude some form of stricture or compression on the bowel at this level. No contrast extravasation.  These results were called to Dr. Daphine Deutscher at the time of interpretation.  Original Report Authenticated  By: Cyndie Chime, M.D.   Anti-infectives: Anti-infectives    None      Assessment/Plan: Problem List: Patient Active Problem List  Diagnoses  . History of vagotomy  . History of laparoscopic adjustable gastric banding    Ready for discharge today.  followup in the office 3 Days Post-Op    LOS: 3 days   Matt B. Daphine Deutscher, MD,  [facility] [phone] beeper [phone]  02/01/2011 7:44 AM

## 2011-02-05 ENCOUNTER — Encounter (HOSPITAL_COMMUNITY): Payer: Self-pay | Admitting: Surgery

## 2011-02-13 ENCOUNTER — Encounter: Payer: Managed Care, Other (non HMO) | Attending: Surgery | Admitting: *Deleted

## 2011-02-13 DIAGNOSIS — Z9884 Bariatric surgery status: Secondary | ICD-10-CM

## 2011-02-13 DIAGNOSIS — Z01818 Encounter for other preprocedural examination: Secondary | ICD-10-CM | POA: Insufficient documentation

## 2011-02-13 DIAGNOSIS — Z713 Dietary counseling and surveillance: Secondary | ICD-10-CM | POA: Insufficient documentation

## 2011-02-13 NOTE — Patient Instructions (Signed)
Patient to follow Phase 3A-Soft, High Protein Diet and follow-up at [facility] in 6 weeks for 2 months post-op nutrition visit for diet advancement. 

## 2011-02-13 NOTE — Progress Notes (Signed)
  Bariatric Class:  Appt start time: 1600 end time:  1700.  2 Week Post-Operative Nutrition Class  Patient was seen on 02/13/2011 for Post[facility]ative [facility]   Surgery date: 01/29/11  Surgery type: Gastric Bypass   Weight today: 196.4 Weight change: 14.3 lbs BMI: 35.4  The following the learning objective met the patient during this course:   Identifies Phase 3A (Soft, High Proteins) Dietary Goals and will begin from 2 weeks post-operatively to 2 months post-operatively   Identifies appropriate sources of fluids and proteins   States protein recommendations and appropriate sources post-operatively  Identifies the need for appropriate texture modifications, mastication, and bite sizes when consuming solids  Identifies appropriate multivitamin and calcium sources post-operatively  Describes the need for physical activity post-operatively and will follow MD recommendations  States when to call healthcare provider regarding medication questions or post-operative complications  Handouts given during class include:  Phase 3A: Soft, High Protein Diet Handout  Band Fill Guidelines Handout  Follow-Up Plan: Patient will follow-up at [facility] in 6 weeks for 2 months post-op nutrition visit for diet advancement per MD.

## 2011-02-15 ENCOUNTER — Encounter (INDEPENDENT_AMBULATORY_CARE_PROVIDER_SITE_OTHER): Payer: Self-pay | Admitting: Surgery

## 2011-02-15 ENCOUNTER — Ambulatory Visit (INDEPENDENT_AMBULATORY_CARE_PROVIDER_SITE_OTHER): Payer: Managed Care, Other (non HMO) | Admitting: Surgery

## 2011-02-15 VITALS — BP 122/72 | HR 68 | Temp 97.2°F | Resp 16 | Ht 62.0 in | Wt 196.6 lb

## 2011-02-15 DIAGNOSIS — Z9884 Bariatric surgery status: Secondary | ICD-10-CM

## 2011-02-15 NOTE — Progress Notes (Signed)
[name] appears to be doing very well. She is taking her vitamins and is mixing fruit with her protein shakes. This enabled her tolerated she otherwise the protein shakes make her nauseated. Her incisions are healing fine. She is doing well. I will see her again in 2 months

## 2011-02-21 ENCOUNTER — Telehealth (INDEPENDENT_AMBULATORY_CARE_PROVIDER_SITE_OTHER): Payer: Self-pay | Admitting: Surgery

## 2011-02-21 NOTE — Telephone Encounter (Signed)
Patient is scheduled to see Dr. Kerry Fort on 04/05/11, needs to know if she needs blood work done before she comes in, please call.

## 2011-02-22 NOTE — Telephone Encounter (Signed)
Called the patients mobile and left a message advising  her I have nothing in her chart stating she needs lab work prior to her visit.

## 2011-03-27 ENCOUNTER — Encounter: Payer: Managed Care, Other (non HMO) | Attending: Surgery | Admitting: *Deleted

## 2011-03-27 ENCOUNTER — Encounter: Payer: Self-pay | Admitting: *Deleted

## 2011-03-27 DIAGNOSIS — Z01818 Encounter for other preprocedural examination: Secondary | ICD-10-CM | POA: Insufficient documentation

## 2011-03-27 DIAGNOSIS — Z713 Dietary counseling and surveillance: Secondary | ICD-10-CM | POA: Insufficient documentation

## 2011-03-27 NOTE — Progress Notes (Addendum)
  Follow-up visit:  8 Weeks Post-Operative Gastric Bypass Surgery  Medical Nutrition Therapy:  Appt start time: 1745 end time:  1830.  Primary concerns today: Post-operative bariatric surgery nutrition management. [name] returns today for her 3 month f/u.  Eating out most meals and reports she can eat a [employer] caesar salad plus a small chili in one sitting without feeling full. Pt also drinks fruit smoothies with added protein powder. States she does not drink during meals, but tries to fill up on fluid before meals to decrease intake.  She is concerned her pouch is too big.  Pt is taking gummy vitamins and using straws without problems.  She is exercising 1.5 hours most days of the week.  Discussed discontinuing gummy vitamins, straws, fruit, and drinking before meals.  Recommended pt f/u with surgeon regarding pouch concerns.   Surgery date: 01/29/11 Start weight at [facility]: 210 lbs  Weight today: 193.7 lbs  Weight change: - 2.8 lbs Total weight lost: 17.0 lbs BMI: 34.8 kg/m^2 Weight goal: 140 lbs % Weight goal met: 24%   TANITA  BODY COMP RESULTS  03/27/11     %Fat 40.9%     FM (lbs) 78.5     FFM (lbs) 114.0     TBW (lbs) 83.5      24-hr recall: Unable to obtain food recall d/t multiple patient questions and lack of time (pt 10 min late to 30 min appt).  Fluid intake: 80-85 oz Estimated total protein intake: 60g  Medications: No changes Supplementation: Taking supplements regularly with no problems; using gummy MVI. States she "thinksRetail banker said it was ok.   Using straws: Yes Drinking while eating: No, though reports drinking before meals to "fill up my stomach" Hair loss: No Carbonated beverages: No N/V/D/C: No Dumping syndrome: No   Recent physical activity:  1.5 hrs/day, most days of the week.   Progress Towards Goal(s):  In progress.  Handouts given during visit include: Phase 3A-Soft, High Protein Diet + Non-starchy vegetables   Nutritional Diagnosis:  [place]-3.3  Overweight/obesity related to recent gastric bypass surgery as evidenced by pt attempting to follow gastric bypass nutrition guidelines for continued weight loss.    Intervention:  Nutrition education.  Monitoring/Evaluation:  Dietary intake, exercise, and body weight. Follow up in 4 weeks for 3 month post-op visit.

## 2011-03-27 NOTE — Patient Instructions (Addendum)
Goals:  Follow Phase 3B: High Protein + Non-Starchy Vegetables  Avoid fruit and high starch foods to promote weight loss.  Eat 3-6 small meals/snacks, every 3-5 hrs.   Continue intake of lean protein foods to meet 60-80 g goal  Continue fluid intake of 64oz +  Avoid drinking 15 minutes before, during and 30 minutes after eating  Continue daily physical activity.  Follow up with Dr. Daphine Deutscher regarding continued appetite after meals.

## 2011-03-29 ENCOUNTER — Telehealth (INDEPENDENT_AMBULATORY_CARE_PROVIDER_SITE_OTHER): Payer: Self-pay

## 2011-03-29 NOTE — Telephone Encounter (Signed)
Patient called expressing her concern that she's not losing enough weight and feel's no restriction.  Patient state's she's lost 20 lbs in 2 months but feels it should be more.  She would like to speak with you regarding this matter at your earliest convenience.

## 2011-03-30 NOTE — Telephone Encounter (Signed)
Called the patient and she states she is having no restriction after her RNY surgery. Expressed to her that we will discuss everything with her at the next scheduled appt.

## 2011-04-05 ENCOUNTER — Encounter (INDEPENDENT_AMBULATORY_CARE_PROVIDER_SITE_OTHER): Payer: Self-pay | Admitting: Surgery

## 2011-04-05 ENCOUNTER — Ambulatory Visit (INDEPENDENT_AMBULATORY_CARE_PROVIDER_SITE_OTHER): Payer: Managed Care, Other (non HMO) | Admitting: Surgery

## 2011-04-05 ENCOUNTER — Telehealth (INDEPENDENT_AMBULATORY_CARE_PROVIDER_SITE_OTHER): Payer: Self-pay | Admitting: Surgery

## 2011-04-05 VITALS — BP 122/84 | HR 64 | Temp 97.2°F | Resp 18 | Ht 62.0 in | Wt 191.8 lb

## 2011-04-05 DIAGNOSIS — Z9884 Bariatric surgery status: Secondary | ICD-10-CM

## 2011-04-05 NOTE — Progress Notes (Signed)
[name] 46 y.o.  Body mass index is 35.08 kg/(m^2).  Patient Active Problem List  Diagnoses  . History of vagotomy  . History of laparoscopic adjustable gastric banding  . 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 Laparoscopic gastric banding 11/2004  . Knee cartilage surgery 2004    right knee  . Tubal ligation   . Childbirth 01-23-11    NVD  . 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  [name] feels like she is not losing weight fast enough. She had removal of her lap band and conversion to Roux-en-Y gastric bypass. She's loss she says about 8 pounds since surgery. She started off very restricted in a developed some bulimic habits preop.  Her incisions are healing fine. She is on her way to [place] with her husband for vacation. I'll see her back in 4-6 weeks in followup. I tried to reassure her that her weight will come down to stay on her diet and to continue her exercise. Matt B. Daphine Deutscher, MD, [facility] [phone] beeper [phone]  04/05/2011 10:44 AM

## 2011-04-25 ENCOUNTER — Ambulatory Visit: Payer: Managed Care, Other (non HMO) | Admitting: *Deleted

## 2011-05-03 ENCOUNTER — Encounter (INDEPENDENT_AMBULATORY_CARE_PROVIDER_SITE_OTHER): Payer: Self-pay | Admitting: Surgery

## 2011-05-10 ENCOUNTER — Ambulatory Visit (INDEPENDENT_AMBULATORY_CARE_PROVIDER_SITE_OTHER): Payer: Managed Care, Other (non HMO) | Admitting: Surgery

## 2011-05-10 ENCOUNTER — Encounter (INDEPENDENT_AMBULATORY_CARE_PROVIDER_SITE_OTHER): Payer: Self-pay | Admitting: Surgery

## 2011-05-10 VITALS — BP 110/70 | HR 45 | Temp 97.9°F | Resp 16 | Ht 63.0 in | Wt 188.4 lb

## 2011-05-10 DIAGNOSIS — Z9884 Bariatric surgery status: Secondary | ICD-10-CM

## 2011-05-10 NOTE — Progress Notes (Signed)
[name] in today after returning from her trip to [place]. She is frustrated because she is only lost down to 188 since her conversion from a lapband vagotomy to a Roux-en-Y gastric bypass. She states that she is able to eat more. This is certainly relative to what she q.d. Before since she was very tight with her band. I reassured her and plan to see her again in 3 months.   Her husband is a Facilities manager and she offered his services for our pitiful website Return in 3 months.

## 2011-08-09 ENCOUNTER — Encounter (INDEPENDENT_AMBULATORY_CARE_PROVIDER_SITE_OTHER): Payer: Self-pay | Admitting: Surgery

## 2011-08-09 ENCOUNTER — Ambulatory Visit (INDEPENDENT_AMBULATORY_CARE_PROVIDER_SITE_OTHER): Payer: Managed Care, Other (non HMO) | Admitting: Surgery

## 2011-08-09 ENCOUNTER — Other Ambulatory Visit (INDEPENDENT_AMBULATORY_CARE_PROVIDER_SITE_OTHER): Payer: Self-pay | Admitting: Surgery

## 2011-08-09 VITALS — BP 118/86 | HR 60 | Temp 97.6°F | Resp 14 | Ht 62.0 in | Wt 189.0 lb

## 2011-08-09 DIAGNOSIS — Z9884 Bariatric surgery status: Secondary | ICD-10-CM

## 2011-08-09 LAB — COMPREHENSIVE METABOLIC PANEL
BUN: 11 mg/dL (ref 6–23)
CO2: 28 mEq/L (ref 19–32)
Calcium: 9.9 mg/dL (ref 8.4–10.5)
Chloride: 103 mEq/L (ref 96–112)
Creat: 0.68 mg/dL (ref 0.50–1.10)
Glucose, Bld: 83 mg/dL (ref 70–99)
Total Bilirubin: 0.7 mg/dL (ref 0.3–1.2)

## 2011-08-09 LAB — CBC
HCT: 42.6 % (ref 36.0–46.0)
MCH: 31.2 pg (ref 26.0–34.0)
MCV: 91 fL (ref 78.0–100.0)
Platelets: 266 10*3/uL (ref 150–400)
RBC: 4.68 MIL/uL (ref 3.87–5.11)
WBC: 5.4 10*3/uL (ref 4.0–10.5)

## 2011-08-09 NOTE — Progress Notes (Signed)
[name] 46 y.o.  Body mass index is 34.57 kg/(m^2).  Patient Active Problem List  Diagnosis  . History of vagotomy  . History of laparoscopic adjustable gastric banding  . 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 Laparoscopic gastric banding 11/2004  . Knee cartilage surgery 2004    right knee  . Tubal ligation   . Childbirth 01-23-11    NVD  . 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  [name] Comes in today we had a long discussion about her frustration with slow weight loss. She is lost 21 pounds since her conversion to a bypass. She was restricted for so long that I think she sort having rebound overeating phenomenon. Today's weight is 189. At pre-curved for a long time about avoidance of carbohydrates as I think that's what is impeding her weight loss. See her back in 2 months challenged her to travel loose Vianne Bulls low carb diet. We will going checks and laboratory data make sure she is not having nutritional deficits. She does have a lot of stressors in her life and because her husband is on short term disability after his hip replacement and a new diagnosis of MS. Matt B. Daphine Deutscher, MD, [facility] [phone] beeper [phone]  08/09/2011 10:57 AM

## 2011-08-09 NOTE — Patient Instructions (Addendum)
Diet Following Bariatric Surgery The bariatric diet is designed to provide fluids and nourishment while promoting weight loss after bariatric surgery. The diet is divided into 3 stages. The rate of progression varies based on individual food tolerance. DIET FOLLOWING BARIATRIC SURGERY The diet following surgery is divided into 3 stages to allow a gradual adjustment. It is very important to the success of your surgery to:  Progress to each stage slowly.   Eat at set times.   Chew food well and stop eating when you are full.   Not drink liquids 30 minutes before and after meals.  If you feel tightness or pressure in your chest, that means you are full. Wait 30 minutes before you try to eat again. STAGE 1 BARIATRIC DIET - ABOUT 2 WEEKS IN DURATION   The diet begins the day of surgery. It will last about 1 to 2 weeks after surgery. Your surgeon may have individual guidelines for you about specific foods or the progression of your diet. Follow your surgeon's guidelines.   If clear liquids are well-tolerated without vomiting, your caregiver will add a 4 oz to 6 oz high protein, low-calorie liquid supplement. You could add this to your meal plan 3 times daily. You will need at least 60 g to 80 g of protein daily or as determined by your Registered Dietitian.   Guidelines for choosing a protein supplement include:   At least 15 g of protein per 8 oz serving.   Less than 20 g total carbohydrate per 8 oz serving.   Less than 5 g fat per 8 oz serving.   Avoid carbonated beverages, caffeine, alcohol, and concentrated sweets such as sugar, cakes, and cookies.   Right after surgery, you may only be able to eat 3 to 4 tsp per meal. Your maximum volume should not exceed  to  cup total. Do not eat or drink more than 1 oz or 2 tbs every 15 minutes.   Take a chewable multivitamin and mineral supplement.   Drink at least 48 oz of fluid daily, which includes your protein supplement.  Food and  beverages from the list below are allowed at set times (for example at 8 AM, 12 noon, or 5 PM):  Decaffeinated coffee or tea.   100% fruit juice.   Diet or sugar-free drinks.   Broth.   Blenderized soup.   Skim milk or lactose-free milk.   Sugar-free gelatin dessert or frozen ice pops.   Mashed potatoes.   Yogurt (artificially sweetened).   Sugar-free pudding.   Blended low-fat cottage cheese.   Unsweetened applesauce, grits, or hot wheat cereal.  Four to six ounces of a liquid protein supplement from the list below is recommended for snacks at 10 AM, 2 PM, and 8 PM.  STAGE 2 BARIATRIC DIET (SOFT DIET) - ABOUT 4 WEEKS IN DURATION  About 2 weeks after surgery, your caregiver will progress your diet to this stage. Foods may need to be blended to the consistency of applesauce. Choose low-fat foods (less than 5 g of fat per serving) and avoid concentrated sweets and sugar (less than 10 g of sugar per serving). Meals should not exceed  to  cup total. This stage will last about 4 weeks. It is recommended that you meet with your dietitian at this stage to begin preparation for the last stage. This stage consists of 3 meals a day with a liquid protein supplement between meals twice daily. Do not drink liquids with foods. You must  wait 30 minutes for the stomach pouch to empty before drinking. Chew food well. The food must be almost liquified before swallowing. Soft foods from the list below can now be slowly added to your diet:  Soft fruit (soft canned fruit in light syrup or natural juice, banana, melon, peaches, pears, or strawberries).   Cooked vegetables.   Toast or crackers (becomes soft after chewing 20 times).   Hot wheat cereal.   Fish.   Eggs (scrambled, soft-boiled).  STAGE 3 BARIATRIC DIET (REGULAR DIET) - ABOUT 6 to 8 WEEKS AFTER SURGERY About 6 to 8 weeks after surgery, you will be advanced to food that is regular in texture. This diet should include all food groups.  The diet will continue to promote weight loss. Meals should not exceed  to 1 cup total. Your dietitian will be available to assist you in meal planning and additional behavioral strategies to make this final stage a long-term success. Slowly add foods of regular consistency and remember:  Eat only at your chosen meal times.   Minimize drinking with meals. You should drink 30 minutes before eating. Do not start drinking again for about 2 hours after eating.   Chew food well. Take small bites.   Think about the portion size of a healthy frozen meal. You will be able to eat most of this.   Make sure your meal is balanced with starch, protein, fruits, and vegetables.   When you feel full, stop eating.  Document Released: 07/08/2002 Document Revised: 12/21/2010 Document Reviewed: 03/31/2010 [facility] Patient Information 2012 [place], [place].

## 2011-08-09 NOTE — Addendum Note (Signed)
Addended by: Latricia Heft on: 08/09/2011 11:04 AM   Modules accepted: Orders

## 2011-08-10 LAB — FOLATE: Folate: 14.8 ng/mL

## 2011-08-13 LAB — VITAMIN B1: Vitamin B1 (Thiamine): 14 nmol/L (ref 8–30)

## 2011-08-22 ENCOUNTER — Telehealth (INDEPENDENT_AMBULATORY_CARE_PROVIDER_SITE_OTHER): Payer: Self-pay | Admitting: General Surgery

## 2011-08-22 NOTE — Telephone Encounter (Signed)
Pt calling for lab results; given.  She remains concerned about lack of weight loss and no feeling of fullness when she eats.

## 2011-10-03 ENCOUNTER — Ambulatory Visit (INDEPENDENT_AMBULATORY_CARE_PROVIDER_SITE_OTHER): Payer: Managed Care, Other (non HMO) | Admitting: Surgery

## 2011-10-03 ENCOUNTER — Encounter (INDEPENDENT_AMBULATORY_CARE_PROVIDER_SITE_OTHER): Payer: Self-pay | Admitting: Surgery

## 2011-10-03 VITALS — BP 122/68 | HR 68 | Temp 97.3°F | Resp 16 | Ht 62.0 in | Wt 192.5 lb

## 2011-10-03 DIAGNOSIS — Z9884 Bariatric surgery status: Secondary | ICD-10-CM

## 2011-10-03 NOTE — Progress Notes (Signed)
[name] comes in today with about a 3 lb weight gain.  She has been tailgating a lot.  Started smoking.Marland Kitchenadvised to stop  Will see back in 6 months

## 2011-10-03 NOTE — Patient Instructions (Signed)
Thanks for your patience.  If you need further assistance after leaving the office, please call our office and speak with a [facility] nurse.  [phone].  If you want to leave a message for Dr. Samaia Iwata, please call his office phone at [phone]. 

## 2011-12-18 ENCOUNTER — Other Ambulatory Visit: Payer: Self-pay | Admitting: Obstetrics and Gynecology

## 2011-12-18 DIAGNOSIS — Z1231 Encounter for screening mammogram for malignant neoplasm of breast: Secondary | ICD-10-CM

## 2012-01-29 ENCOUNTER — Ambulatory Visit
Admission: RE | Admit: 2012-01-29 | Discharge: 2012-01-29 | Disposition: A | Discharge status: Home / Self Care | Payer: Managed Care, Other (non HMO) | Source: Ambulatory Visit | Attending: Obstetrics and Gynecology | Admitting: Obstetrics and Gynecology

## 2012-01-29 DIAGNOSIS — Z1231 Encounter for screening mammogram for malignant neoplasm of breast: Secondary | ICD-10-CM

## 2012-06-24 ENCOUNTER — Telehealth (INDEPENDENT_AMBULATORY_CARE_PROVIDER_SITE_OTHER): Payer: Self-pay

## 2012-06-24 NOTE — Telephone Encounter (Signed)
Appointment reminder card mailed to patient for 07/11/12 @ 1:30 pm w/Dr. Daphine Deutscher.

## 2012-07-11 ENCOUNTER — Encounter (INDEPENDENT_AMBULATORY_CARE_PROVIDER_SITE_OTHER): Payer: Self-pay | Admitting: Surgery

## 2012-07-11 ENCOUNTER — Ambulatory Visit (INDEPENDENT_AMBULATORY_CARE_PROVIDER_SITE_OTHER): Payer: Managed Care, Other (non HMO) | Admitting: Surgery

## 2012-07-11 ENCOUNTER — Other Ambulatory Visit (INDEPENDENT_AMBULATORY_CARE_PROVIDER_SITE_OTHER): Payer: Self-pay

## 2012-07-11 VITALS — BP 110/74 | HR 80 | Temp 97.6°F | Resp 16 | Ht 62.0 in | Wt 201.6 lb

## 2012-07-11 DIAGNOSIS — Z9884 Bariatric surgery status: Secondary | ICD-10-CM

## 2012-07-11 NOTE — Progress Notes (Signed)
[name] 47 y.o.  Body mass index is 36.86 kg/(m^2).  Patient Active Problem List   Diagnosis Date Noted  . 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 History of vagotomy 11/23/2010  . History of laparoscopic adjustable gastric banding 11/23/2010    No Known Allergies  Past Surgical History  Procedure Laterality Date  . Laparoscopic gastric banding  11/2004  . Knee cartilage surgery  2004    right knee  . Tubal ligation    . Childbirth  01-23-11    NVD  . Gastric roux-en-y  01/29/2011    Procedure: LAPAROSCOPIC ROUX-EN-Y GASTRIC;  Surgeon: Valarie Merino, MD;  Location: [facility];  Service: General;  Laterality: N/A;  Removal of Lap Band Conversion to Roux Y Gastric Bypass    MEISINGER,TODD D, MD No diagnosis found.  18 months post removal of lapband (vagotomy pt).  Pt reports weight plateau at 200 lbs.  Her dietary habits could stand some review from [name].  Will set up appt and I will see again in Jan which will be two year visit.  Matt B. Daphine Deutscher, MD, [facility] [phone] beeper [phone]  07/11/2012 2:17 PM

## 2012-07-11 NOTE — Patient Instructions (Signed)
Take bariatric vitamins Work out (with your son) Followup with dietary consult

## 2012-08-12 ENCOUNTER — Ambulatory Visit: Payer: Managed Care, Other (non HMO) | Admitting: *Deleted

## 2012-09-01 ENCOUNTER — Encounter: Payer: Managed Care, Other (non HMO) | Attending: Obstetrics and Gynecology | Admitting: *Deleted

## 2012-09-01 ENCOUNTER — Encounter: Payer: Self-pay | Admitting: *Deleted

## 2012-09-01 DIAGNOSIS — Z9884 Bariatric surgery status: Secondary | ICD-10-CM | POA: Insufficient documentation

## 2012-09-01 DIAGNOSIS — Z713 Dietary counseling and surveillance: Secondary | ICD-10-CM | POA: Insufficient documentation

## 2012-09-01 DIAGNOSIS — Z09 Encounter for follow-up examination after completed treatment for conditions other than malignant neoplasm: Secondary | ICD-10-CM | POA: Insufficient documentation

## 2012-09-01 NOTE — Progress Notes (Addendum)
  Follow-up visit:  19 Months Post-Operative Gastric Bypass Surgery  Medical Nutrition Therapy:  Appt start time: 1745   End time:  1845.  Primary concerns today: Post-operative bariatric surgery nutrition management. Tracey returns today for f/u after 1.5 year hiatus from [facility]. Very frustrated that she has not lost the weight she wanted. Only eats 2 meals a day and states she can still eat as much as she wants. High CHO intake noted. It appears she is not following the diet and has gained 8 lbs. Reports increased depression d/t husband's Parkinson's and young son at home. Husband very stressed and anxious/depressed, which causes her to eat emotionally. Eats out often and has started smoking more. Recommended pt f/u with surgeon regarding pouch concerns.   Surgery date: 01/29/11 Start weight at [facility]: 210 lbs (01/04/11)  Weight today: 202.0 lbs  Weight change: + 8.3 lbs Total weight lost: 8.7 lbs  Weight goal: 140 lbs % Weight goal met: 12%  TANITA  BODY COMP RESULTS  03/27/11 09/01/12    BMI (kg/m^2) 34.8 36.9    FM (lbs) 78.5 93.5    FFM (lbs) 114.0 108.5    TBW (lbs) 83.5 79.5   24-hr recall:  B: Coffee S: Sunflower seeds (buys in shells) or 2 oz almonds/red licorice L: Dannon [employer] & Fit Greek yogurt or salad S: 2 oz Nuts  D: Shake & baked pork chops (3 oz) with 1 tbsp mac-n-cheese, green beans (steamed) OR 2 pcs square thin crust pizza S: Yogurt or nuts  Fluid intake: 80-85 oz Estimated total protein intake: 60g  Medications: No changes Supplementation: Not taking consistently   Using straws: Yes Drinking while eating: No Hair loss: No Carbonated beverages: No N/V/D/C: No Dumping syndrome: No   Recent physical activity:  Walks most days of the week.   Progress Towards Goal(s):  In progress.  Handouts given during visit include: Bariatric Surgery Specialized Post-Op Diet [place] Rx Yellow Meal Planning Card  Samples given during visit include:   BariActiv MVI:  1 pkt Lot: [id number] S: Exp: 05/16  BariActiv Calcium: 1 pkt Lot: [id number] S: Exp: 05/16   Nutritional Diagnosis:  [place]-3.3 Overweight/obesity related to recent gastric bypass surgery as evidenced by pt attempting to follow gastric bypass nutrition guidelines for continued weight loss.    Intervention:  Nutrition education/reinforcement.  Monitoring/Evaluation:  Dietary intake, exercise, and body weight. Follow up in 4-6 weeks.

## 2012-09-01 NOTE — Patient Instructions (Addendum)
Goals:  Follow Specialized Post-Op Diet  Avoid fruit and high starch foods to promote weight loss.  Keep carbs to 15g per meal or snack  Eat 3-6 small meals/snacks, every 3-5 hrs.   Increase intake of lean protein foods to meet 60-80 g goal  Continue fluid intake of 64oz +  Avoid drinking 15 minutes before, during and 30 minutes after eating  Increase daily physical activity.  Follow up with Dr. Daphine Deutscher regarding continued appetite after meals.

## 2012-10-13 ENCOUNTER — Ambulatory Visit: Payer: Managed Care, Other (non HMO) | Admitting: *Deleted

## 2013-01-23 ENCOUNTER — Ambulatory Visit (INDEPENDENT_AMBULATORY_CARE_PROVIDER_SITE_OTHER): Payer: Commercial Indemnity | Admitting: Surgery

## 2013-01-23 DIAGNOSIS — Z6835 Body mass index (BMI) 35.0-35.9, adult: Secondary | ICD-10-CM

## 2013-01-23 NOTE — Progress Notes (Signed)
[name] 48 y.o.  Body mass index is 35.26 kg/(m^2).  Patient Active Problem List   Diagnosis Date Noted  . 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 History of vagotomy 11/23/2010  . History of laparoscopic adjustable gastric banding 11/23/2010    No Known Allergies  Past Surgical History  Procedure Laterality Date  . Laparoscopic gastric banding  11/2004  . Knee cartilage surgery  2004    right knee  . Tubal ligation    . Childbirth  01-23-11    NVD  . Gastric roux-en-y  01/29/2011    Procedure: LAPAROSCOPIC ROUX-EN-Y GASTRIC;  Surgeon: Valarie MerinoMatthew B Dacen Frayre, MD;  Location: [facility];  Service: General;  Laterality: N/A;  Removal of Lap Band Conversion to Roux Y Gastric Bypass    MEISINGER,TODD D, MD No diagnosis found.  Anita AlfMarla is coping with a lot of issues including the recent disability of her husband, her college graduate [employer]beam back at home and stress to try to find a part-time job. She is frustrated because her weight has plateaued around 205-10. Today is 205.8. She knows what she is doing on in that she has stress eating. I think that if she were to avoid carb should probably get down to the 180 range previously. I want to get her an appointment with Lyanne CoEd Lurey about avoidance of stress eating.    Return 6 months. Anita B. Daphine DeutscherMartin, MD, [facility]  [facility] [phone] beeper [phone]  01/23/2013 12:53 PM

## 2013-05-25 ENCOUNTER — Other Ambulatory Visit: Payer: Self-pay | Admitting: Obstetrics and Gynecology

## 2013-05-25 DIAGNOSIS — R928 Other abnormal and inconclusive findings on diagnostic imaging of breast: Secondary | ICD-10-CM

## 2013-06-05 ENCOUNTER — Encounter (INDEPENDENT_AMBULATORY_CARE_PROVIDER_SITE_OTHER): Payer: Self-pay

## 2013-06-05 ENCOUNTER — Other Ambulatory Visit: Payer: Self-pay | Admitting: Obstetrics and Gynecology

## 2013-06-05 ENCOUNTER — Ambulatory Visit
Admission: RE | Admit: 2013-06-05 | Discharge: 2013-06-05 | Disposition: A | Discharge status: Home / Self Care | Payer: 59 | Source: Ambulatory Visit | Attending: Obstetrics and Gynecology | Admitting: Obstetrics and Gynecology

## 2013-06-05 DIAGNOSIS — N63 Unspecified lump in unspecified breast: Secondary | ICD-10-CM

## 2013-06-05 DIAGNOSIS — R928 Other abnormal and inconclusive findings on diagnostic imaging of breast: Secondary | ICD-10-CM

## 2013-08-28 ENCOUNTER — Other Ambulatory Visit: Payer: Self-pay | Admitting: Orthopaedic Surgery

## 2013-08-28 DIAGNOSIS — M542 Cervicalgia: Secondary | ICD-10-CM

## 2013-09-01 ENCOUNTER — Ambulatory Visit
Admission: RE | Admit: 2013-09-01 | Discharge: 2013-09-01 | Disposition: A | Discharge status: Home / Self Care | Payer: 59 | Source: Ambulatory Visit | Attending: Orthopaedic Surgery | Admitting: Orthopaedic Surgery

## 2013-09-01 DIAGNOSIS — M542 Cervicalgia: Secondary | ICD-10-CM

## 2013-09-15 ENCOUNTER — Other Ambulatory Visit (HOSPITAL_COMMUNITY): Payer: Self-pay | Admitting: Orthopaedic Surgery

## 2013-09-25 NOTE — H&P (Signed)
[facility]   A Division of [employer]   [address], [place], [place] [address] Telephone: [phone]  Fax: [phone]     PATIENT: [name], [name]   MR#: [id number]  DOB: [birth year]       The patient is a 48 year old female who has persistent severe neck pain that radiates into her shoulder when she turns her neck.  She states she cannot get comfortable and has trouble sleeping.  She still puts her hand on top of her head, at least half of the day on the right.  She has been to a chiropractor and has been through anti-inflammatory medications.  Got relief from the prednisone for the first 3 to 4 days when she was on high doses, but when the taper began, she had recurrence of her pain symptoms.  Pain radiates from her neck into her right arm in the shoulder blade and trapezial region and out to the hand, primarily the radial side.  She notes that she has dropped objects and her hand feels weak.    PAST SURGICAL HISTORY:  Include LAP-BAND surgery and knee arthroscopy on the right.   SOCIAL HISTORY:  The patient stays at home.  She is here with her husband, Onalee Hua.  She does not smoke.  Did used to smoke 1/2 pack per day for 20 year.  She does not drink.   REVIEW OF SYSTEMS:  Positive for migraines and mononucleosis.   MEDICATIONS:  Include Wellbutrin XL 150 mg daily, vitamin B12, and ibuprofen.   PHYSICAL EXAMINATION:  Height 5 feet 5 inches.  Weight 195 pounds. Heart and lungs sounds clear.  She has exquisite brachial plexus tenderness on the right.  Positive Spurling's on the right.  Increased pain with cervical compression.  No relief with distraction.  Upper extremity reflexes are 2+ biceps, 2+ triceps, 2+ brachial radialis.  Lower extremity reflexes are normal.  She is able to heel-and-toe walk. No lower extremity hyperreflexia.  Pelvis is level.  Quad strength is good.  No gastrocsoleus weakness.   RADIOGRAPHS:  MRI scan is reviewed with the  patient and her husband.  This shows significant cervical spondylosis with moderate to severe spinal stenosis at the C5-6 level, which shows most severe compression on the right neural foramina, moderate on the left.  Broad-based disc osteophyte at C6-7 where there is moderate to severe left foraminal stenosis and moderate right.  Mild changes at C4-5 and C3-4.   ASSESSMENT/PLAN:  Discussed with the patient she has significant cervical spondylosis with moderate to severe areas of compression.  Operative choice would be a 2-level cervical fusion at C5-6 and C6-7 allograft plate.  Overnight stay in the hospital.  We discussed soft collar usage.  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  We discussed avoiding activities that would put her at risk for injury to her neck in the meantime.  Operative technique discussed.  We looked at a spine model.  Looked at pictures of other patients who had the procedure.  Discussed incision, plate, risks, dysphagia, dysphonia, pseudarthrosis, re-operation, use of the soft collar, overnight stay.  All questions answered and she requests to proceed.         Mark C. Ophelia Charter, M.D.    Auto-Authenticated by Veverly Fells. Ophelia Charter, M.D.

## 2013-09-28 ENCOUNTER — Encounter (HOSPITAL_COMMUNITY): Payer: Self-pay | Admitting: Pharmacy Technician

## 2013-09-28 ENCOUNTER — Ambulatory Visit (HOSPITAL_COMMUNITY)
Admission: RE | Admit: 2013-09-28 | Discharge: 2013-09-28 | Disposition: A | Discharge status: Home / Self Care | Payer: 59 | Source: Ambulatory Visit | Attending: Orthopaedic Surgery | Admitting: Orthopaedic Surgery

## 2013-09-28 ENCOUNTER — Encounter (HOSPITAL_COMMUNITY): Payer: Self-pay

## 2013-09-28 ENCOUNTER — Encounter (HOSPITAL_COMMUNITY)
Admission: RE | Admit: 2013-09-28 | Discharge: 2013-09-28 | Disposition: A | Discharge status: Home / Self Care | Payer: 59 | Source: Ambulatory Visit | Attending: Orthopaedic Surgery | Admitting: Orthopaedic Surgery

## 2013-09-28 DIAGNOSIS — Z01818 Encounter for other preprocedural examination: Secondary | ICD-10-CM | POA: Insufficient documentation

## 2013-09-28 DIAGNOSIS — M47812 Spondylosis without myelopathy or radiculopathy, cervical region: Secondary | ICD-10-CM | POA: Diagnosis not present

## 2013-09-28 HISTORY — DX: Unspecified osteoarthritis, unspecified site: M19.90

## 2013-09-28 LAB — COMPREHENSIVE METABOLIC PANEL
ALT: 23 U/L (ref 0–35)
ANION GAP: 12 (ref 5–15)
AST: 22 U/L (ref 0–37)
Albumin: 4.1 g/dL (ref 3.5–5.2)
Alkaline Phosphatase: 94 U/L (ref 39–117)
BUN: 10 mg/dL (ref 6–23)
CO2: 25 mEq/L (ref 19–32)
CREATININE: 0.65 mg/dL (ref 0.50–1.10)
Calcium: 9.1 mg/dL (ref 8.4–10.5)
Chloride: 102 mEq/L (ref 96–112)
GFR calc non Af Amer: >90 mL/min (ref >90)
GLUCOSE: 88 mg/dL (ref 70–99)
Potassium: 4.1 mEq/L (ref 3.7–5.3)
Sodium: 139 mEq/L (ref 137–147)
TOTAL PROTEIN: 7.1 g/dL (ref 6.0–8.3)
Total Bilirubin: 0.4 mg/dL (ref 0.3–1.2)

## 2013-09-28 LAB — URINALYSIS, ROUTINE W REFLEX MICROSCOPIC
Bilirubin Urine: NEGATIVE
GLUCOSE, UA: NEGATIVE mg/dL
HGB URINE DIPSTICK: NEGATIVE
KETONES UR: NEGATIVE mg/dL
Nitrite: NEGATIVE
Protein, ur: NEGATIVE mg/dL
Specific Gravity, Urine: 1.012 (ref 1.005–1.030)
UROBILINOGEN UA: 0.2 mg/dL (ref 0.0–1.0)
pH: 6.5 (ref 5.0–8.0)

## 2013-09-28 LAB — CBC
HEMATOCRIT: 41.7 % (ref 36.0–46.0)
HEMOGLOBIN: 14.2 g/dL (ref 12.0–15.0)
MCH: 31 pg (ref 26.0–34.0)
MCHC: 34.1 g/dL (ref 30.0–36.0)
MCV: 91 fL (ref 78.0–100.0)
Platelets: 252 10*3/uL (ref 150–400)
RBC: 4.58 MIL/uL (ref 3.87–5.11)
RDW: 12.2 % (ref 11.5–15.5)
WBC: 4.6 10*3/uL (ref 4.0–10.5)

## 2013-09-28 LAB — SURGICAL PCR SCREEN
MRSA, PCR: NEGATIVE
Staphylococcus aureus: NEGATIVE

## 2013-09-28 LAB — URINE MICROSCOPIC-ADD ON

## 2013-09-28 LAB — PROTIME-INR
INR: 1.04 (ref 0.00–1.49)
PROTHROMBIN TIME: 13.6 s (ref 11.6–15.2)

## 2013-09-28 NOTE — Pre-Procedure Instructions (Signed)
[name]  09/28/2013   Your procedure is scheduled on:  Wednesday September 16 th at 1240 PM  Report to [facility] Admitting at 1040 AM AM.  Call this number if you have problems the morning of surgery: [phone]   Remember:   Do not eat food or drink liquids after midnight.   Take these medicines the morning of surgery with A SIP OF WATER: Tylenol if needed for pain Stop taking Aspirin, Vitamins, Herbal medication, and Nsaids 5 days prior to surgery.  Do not wear jewelry, make-up or nail polish.  Do not wear lotions, powders, or perfumes. You may wear deodorant.  Do not shave 48 hours prior to surgery.  Do not bring valuables to the hospital.  [facility] is not responsible for any belongings or valuables.               Contacts, dentures or bridgework may not be worn into surgery.  Leave suitcase in the car. After surgery it may be brought to your room.  For patients admitted to the hospital, discharge time is determined by your treatment team.               Patients discharged the day of surgery will not be allowed to drive home.    Special Instructions: [place] - Preparing for Surgery  Before surgery, you can play an important role.  Because skin is not sterile, your skin needs to be as free of germs as possible.  You can reduce the number of germs on you skin by washing with CHG (chlorahexidine gluconate) soap before surgery.  CHG is an antiseptic cleaner which kills germs and bonds with the skin to continue killing germs even after washing.  Please DO NOT use if you have an allergy to CHG or antibacterial soaps.  If your skin becomes reddened/irritated stop using the CHG and inform your nurse when you arrive at Short Stay.  Do not shave (including legs and underarms) for at least 48 hours prior to the first CHG shower.  You may shave your face.  Please follow these instructions carefully:   1.  Shower with CHG Soap the night before surgery and the                                 morning of Surgery.  2.  If you choose to wash your hair, wash your hair first as usual with your       normal shampoo.  3.  After you shampoo, rinse your hair and body thoroughly to remove the                      Shampoo.  4.  Use CHG as you would any other liquid soap.  You can apply chg directly       to the skin and wash gently with scrungie or a clean washcloth.  5.  Apply the CHG Soap to your body ONLY FROM THE NECK DOWN.        Do not use on open wounds or open sores.  Avoid contact with your eyes,       ears, mouth and genitals (private parts).  Wash genitals (private parts)       with your normal soap.  6.  Wash thoroughly, paying special attention to the area where your surgery        will be  performed.  7.  Thoroughly rinse your body with warm water from the neck down.  8.  DO NOT shower/wash with your normal soap after using and rinsing off       the CHG Soap.  9.  Pat yourself dry with a clean towel.            10.  Wear clean pajamas.            11.  Place clean sheets on your bed the night of your first shower and do not        sleep with pets.  Day of Surgery  Do not apply any lotions/deoderants the morning of surgery.  Please wear clean clothes to the hospital/surgery center.      Please read over the following fact sheets that you were given: Pain Booklet, Coughing and Deep Breathing, Blood Transfusion Information, MRSA Information and Surgical Site Infection Prevention

## 2013-09-29 MED ORDER — CEFAZOLIN SODIUM-DEXTROSE 2-3 GM-% IV SOLR
2.0000 g | INTRAVENOUS | Status: AC
  Administered 2013-09-30: 2 g via INTRAVENOUS
  Filled 2013-09-29: qty 50

## 2013-09-29 NOTE — Progress Notes (Signed)
Anesthesia Chart Review:  Patient is a 48 year old female scheduled for C5-6, C6-7 ACDF by Dr. Ophelia Charter on 09/30/13.  History includes gestational HTN and DM (no longer requiring medication), HLD, migraines, GERD, dysphagia, anxiety, vertigo '13, smoking, post-operative N/V, gastric banding.  BMI is consistent with obesity.   EKG on 09/28/13 showed: NSR, baseline wanderer in V1 and V2 limits interpretation. Non-specific T wave abnormality in inferior leads.   Preoperative CXR and labs noted. UA results routed to Dr. Ophelia Charter and voice message left with Elnita Maxwell.  Defer additional recommendations, if any, to the surgeon.  Velna [facility] Short Stay Center/Anesthesiology Phone [phone] 09/29/2013 9:47 AM

## 2013-09-30 ENCOUNTER — Observation Stay (HOSPITAL_COMMUNITY)
Admission: RE | Admit: 2013-09-30 | Discharge: 2013-10-01 | Disposition: A | Discharge status: Home / Self Care | Payer: 59 | Source: Ambulatory Visit | Attending: Orthopaedic Surgery | Admitting: Orthopaedic Surgery

## 2013-09-30 ENCOUNTER — Encounter (HOSPITAL_COMMUNITY): Payer: 59 | Admitting: Vascular Surgery

## 2013-09-30 ENCOUNTER — Observation Stay (HOSPITAL_COMMUNITY): Payer: 59

## 2013-09-30 ENCOUNTER — Encounter (HOSPITAL_COMMUNITY)
Admission: RE | Disposition: A | Discharge status: Home / Self Care | Payer: Self-pay | Source: Ambulatory Visit | Attending: Orthopaedic Surgery

## 2013-09-30 ENCOUNTER — Ambulatory Visit (HOSPITAL_COMMUNITY): Payer: 59 | Admitting: Anesthesiology

## 2013-09-30 ENCOUNTER — Encounter (HOSPITAL_COMMUNITY): Payer: Self-pay | Admitting: Anesthesiology

## 2013-09-30 DIAGNOSIS — Z9884 Bariatric surgery status: Secondary | ICD-10-CM | POA: Diagnosis not present

## 2013-09-30 DIAGNOSIS — Z87891 Personal history of nicotine dependence: Secondary | ICD-10-CM | POA: Diagnosis not present

## 2013-09-30 DIAGNOSIS — G43909 Migraine, unspecified, not intractable, without status migrainosus: Secondary | ICD-10-CM | POA: Diagnosis not present

## 2013-09-30 DIAGNOSIS — N390 Urinary tract infection, site not specified: Secondary | ICD-10-CM | POA: Diagnosis present

## 2013-09-30 DIAGNOSIS — M5020 Other cervical disc displacement, unspecified cervical region: Secondary | ICD-10-CM | POA: Diagnosis present

## 2013-09-30 DIAGNOSIS — M47812 Spondylosis without myelopathy or radiculopathy, cervical region: Principal | ICD-10-CM | POA: Insufficient documentation

## 2013-09-30 DIAGNOSIS — M4802 Spinal stenosis, cervical region: Secondary | ICD-10-CM | POA: Diagnosis present

## 2013-09-30 HISTORY — PX: ANTERIOR CERVICAL DECOMP/DISCECTOMY FUSION: SHX1161

## 2013-09-30 LAB — GLUCOSE, CAPILLARY
Glucose-Capillary: 105 mg/dL — ABNORMAL HIGH (ref 70–99)
Glucose-Capillary: 162 mg/dL — ABNORMAL HIGH (ref 70–99)

## 2013-09-30 LAB — HCG, SERUM, QUALITATIVE: Preg, Serum: NEGATIVE

## 2013-09-30 SURGERY — ANTERIOR CERVICAL DECOMPRESSION/DISCECTOMY FUSION 2 LEVELS
Anesthesia: General | Site: Neck

## 2013-09-30 MED ORDER — LIDOCAINE HCL (CARDIAC) 20 MG/ML IV SOLN: INTRAVENOUS | Status: DC | PRN

## 2013-09-30 MED ORDER — OXYCODONE HCL 5 MG/5ML PO SOLN
ORAL | Status: AC
  Filled 2013-09-30: qty 5

## 2013-09-30 MED ORDER — LIDOCAINE HCL (CARDIAC) 20 MG/ML IV SOLN
INTRAVENOUS | Status: DC | PRN
  Administered 2013-09-30: 100 mg via INTRAVENOUS

## 2013-09-30 MED ORDER — SCOPOLAMINE 1 MG/3DAYS TD PT72
MEDICATED_PATCH | TRANSDERMAL | Status: AC
  Administered 2013-09-30: 1.5 mg via TRANSDERMAL
  Filled 2013-09-30: qty 1

## 2013-09-30 MED ORDER — PHENOL 1.4 % MT LIQD
1.0000 | OROMUCOSAL | Status: DC | PRN
  Filled 2013-09-30: qty 177

## 2013-09-30 MED ORDER — ONDANSETRON HCL 4 MG/2ML IJ SOLN
INTRAMUSCULAR | Status: AC
  Filled 2013-09-30: qty 2

## 2013-09-30 MED ORDER — FLEET ENEMA 7-19 GM/118ML RE ENEM
1.0000 | ENEMA | Freq: Once | RECTAL | Status: AC | PRN
Start: 2013-09-30 — End: 2013-09-30
  Filled 2013-09-30: qty 1

## 2013-09-30 MED ORDER — HYDROMORPHONE HCL 1 MG/ML IJ SOLN
INTRAMUSCULAR | Status: AC
  Administered 2013-09-30: 0.5 mg via INTRAVENOUS
  Filled 2013-09-30: qty 1

## 2013-09-30 MED ORDER — NEOSTIGMINE METHYLSULFATE 10 MG/10ML IV SOLN
INTRAVENOUS | Status: AC
  Filled 2013-09-30: qty 1

## 2013-09-30 MED ORDER — FENTANYL CITRATE 0.05 MG/ML IJ SOLN
INTRAMUSCULAR | Status: AC
  Filled 2013-09-30: qty 5

## 2013-09-30 MED ORDER — OXYCODONE HCL 5 MG PO TABS: 5.0000 mg | ORAL_TABLET | Freq: Once | ORAL | Status: AC | PRN

## 2013-09-30 MED ORDER — PROPOFOL 10 MG/ML IV BOLUS
INTRAVENOUS | Status: AC
  Filled 2013-09-30: qty 20

## 2013-09-30 MED ORDER — THROMBIN 20000 UNITS EX SOLR
CUTANEOUS | Status: AC
  Filled 2013-09-30: qty 20000

## 2013-09-30 MED ORDER — HYDROMORPHONE HCL 1 MG/ML IJ SOLN
0.2500 mg | INTRAMUSCULAR | Status: DC | PRN
  Administered 2013-09-30 (×4): 0.5 mg via INTRAVENOUS

## 2013-09-30 MED ORDER — ROCURONIUM BROMIDE 50 MG/5ML IV SOLN
INTRAVENOUS | Status: AC
  Filled 2013-09-30: qty 1

## 2013-09-30 MED ORDER — PANTOPRAZOLE SODIUM 40 MG IV SOLR
40.0000 mg | Freq: Every day | INTRAVENOUS | Status: DC
  Filled 2013-09-30: qty 40

## 2013-09-30 MED ORDER — FENTANYL CITRATE 0.05 MG/ML IJ SOLN
INTRAMUSCULAR | Status: AC
  Administered 2013-09-30: 50 ug via INTRAVENOUS
  Filled 2013-09-30: qty 2

## 2013-09-30 MED ORDER — GLYCOPYRROLATE 0.2 MG/ML IJ SOLN
INTRAMUSCULAR | Status: AC
  Filled 2013-09-30: qty 1

## 2013-09-30 MED ORDER — BISACODYL 10 MG RE SUPP
10.0000 mg | Freq: Every day | RECTAL | Status: DC | PRN
Start: 2013-09-30 — End: 2013-10-01

## 2013-09-30 MED ORDER — KCL IN DEXTROSE-NACL 20-5-0.45 MEQ/L-%-% IV SOLN
INTRAVENOUS | Status: DC
  Filled 2013-09-30 (×3): qty 1000

## 2013-09-30 MED ORDER — 0.9 % SODIUM CHLORIDE (POUR BTL) OPTIME
TOPICAL | Status: DC | PRN
  Administered 2013-09-30: 1000 mL

## 2013-09-30 MED ORDER — BUPIVACAINE HCL 0.25 % IJ SOLN
INTRAMUSCULAR | Status: DC | PRN
  Administered 2013-09-30: 6 mL

## 2013-09-30 MED ORDER — MIDAZOLAM HCL 5 MG/5ML IJ SOLN
INTRAMUSCULAR | Status: DC | PRN
  Administered 2013-09-30 (×2): 1 mg via INTRAVENOUS

## 2013-09-30 MED ORDER — SODIUM CHLORIDE 0.9 % IJ SOLN: 3.0000 mL | Freq: Two times a day (BID) | INTRAMUSCULAR | Status: DC

## 2013-09-30 MED ORDER — METHOCARBAMOL 1000 MG/10ML IJ SOLN
500.0000 mg | Freq: Four times a day (QID) | INTRAVENOUS | Status: DC | PRN
  Administered 2013-09-30: 500 mg via INTRAVENOUS
  Filled 2013-09-30: qty 5

## 2013-09-30 MED ORDER — DOCUSATE SODIUM 100 MG PO CAPS
100.0000 mg | ORAL_CAPSULE | Freq: Two times a day (BID) | ORAL | Status: DC
  Administered 2013-09-30 – 2013-10-01 (×2): 100 mg via ORAL
  Filled 2013-09-30 (×3): qty 1

## 2013-09-30 MED ORDER — KETOROLAC TROMETHAMINE 30 MG/ML IJ SOLN
30.0000 mg | Freq: Four times a day (QID) | INTRAMUSCULAR | Status: DC
  Administered 2013-09-30 – 2013-10-01 (×2): 30 mg via INTRAVENOUS
  Filled 2013-09-30 (×6): qty 1

## 2013-09-30 MED ORDER — PANTOPRAZOLE SODIUM 40 MG PO TBEC
40.0000 mg | DELAYED_RELEASE_TABLET | Freq: Every day | ORAL | Status: DC
  Administered 2013-09-30: 40 mg via ORAL

## 2013-09-30 MED ORDER — GLYCOPYRROLATE 0.2 MG/ML IJ SOLN
INTRAMUSCULAR | Status: AC
  Filled 2013-09-30: qty 3

## 2013-09-30 MED ORDER — FENTANYL CITRATE 0.05 MG/ML IJ SOLN
INTRAMUSCULAR | Status: DC | PRN
  Administered 2013-09-30: 50 ug via INTRAVENOUS
  Administered 2013-09-30: 100 ug via INTRAVENOUS
  Administered 2013-09-30 (×4): 50 ug via INTRAVENOUS

## 2013-09-30 MED ORDER — METHOCARBAMOL 500 MG PO TABS
500.0000 mg | ORAL_TABLET | Freq: Four times a day (QID) | ORAL | Status: DC | PRN
  Administered 2013-10-01 (×2): 500 mg via ORAL
  Filled 2013-09-30 (×3): qty 1

## 2013-09-30 MED ORDER — METHOCARBAMOL 500 MG PO TABS: 500.0000 mg | ORAL_TABLET | Freq: Four times a day (QID) | ORAL | Status: DC | PRN

## 2013-09-30 MED ORDER — LIDOCAINE HCL (CARDIAC) 20 MG/ML IV SOLN
INTRAVENOUS | Status: AC
  Filled 2013-09-30: qty 5

## 2013-09-30 MED ORDER — LACTATED RINGERS IV SOLN
INTRAVENOUS | Status: DC | PRN
  Administered 2013-09-30 (×2): via INTRAVENOUS

## 2013-09-30 MED ORDER — DEXAMETHASONE SODIUM PHOSPHATE 4 MG/ML IJ SOLN
INTRAMUSCULAR | Status: DC | PRN
  Administered 2013-09-30: 8 mg via INTRAVENOUS

## 2013-09-30 MED ORDER — LACTATED RINGERS IV SOLN
INTRAVENOUS | Status: DC
  Administered 2013-09-30: 12:00:00 via INTRAVENOUS

## 2013-09-30 MED ORDER — CIPROFLOXACIN HCL 500 MG PO TABS
500.0000 mg | ORAL_TABLET | Freq: Two times a day (BID) | ORAL | Status: DC
  Administered 2013-09-30 – 2013-10-01 (×2): 500 mg via ORAL
  Filled 2013-09-30 (×4): qty 1

## 2013-09-30 MED ORDER — MORPHINE SULFATE 2 MG/ML IJ SOLN: 1.0000 mg | INTRAMUSCULAR | Status: DC | PRN

## 2013-09-30 MED ORDER — MIDAZOLAM HCL 2 MG/2ML IJ SOLN
INTRAMUSCULAR | Status: AC
  Filled 2013-09-30: qty 2

## 2013-09-30 MED ORDER — ACETAMINOPHEN 650 MG RE SUPP
650.0000 mg | RECTAL | Status: DC | PRN
Start: 2013-09-30 — End: 2013-10-01

## 2013-09-30 MED ORDER — PROPOFOL 10 MG/ML IV BOLUS
INTRAVENOUS | Status: DC | PRN
  Administered 2013-09-30: 200 mg via INTRAVENOUS

## 2013-09-30 MED ORDER — OXYCODONE-ACETAMINOPHEN 5-325 MG PO TABS: 1.0000 | ORAL_TABLET | ORAL | Status: DC | PRN

## 2013-09-30 MED ORDER — BUPIVACAINE HCL (PF) 0.25 % IJ SOLN
INTRAMUSCULAR | Status: AC
  Filled 2013-09-30: qty 30

## 2013-09-30 MED ORDER — ACETAMINOPHEN 325 MG PO TABS: 650.0000 mg | ORAL_TABLET | ORAL | Status: DC | PRN

## 2013-09-30 MED ORDER — SODIUM CHLORIDE 0.9 % IV SOLN: 250.0000 mL | INTRAVENOUS | Status: DC

## 2013-09-30 MED ORDER — CEFAZOLIN SODIUM 1-5 GM-% IV SOLN
1.0000 g | Freq: Three times a day (TID) | INTRAVENOUS | Status: AC
  Administered 2013-09-30 – 2013-10-01 (×2): 1 g via INTRAVENOUS
  Filled 2013-09-30 (×2): qty 50

## 2013-09-30 MED ORDER — ONDANSETRON HCL 4 MG/2ML IJ SOLN: 4.0000 mg | INTRAMUSCULAR | Status: DC | PRN

## 2013-09-30 MED ORDER — FENTANYL CITRATE 0.05 MG/ML IJ SOLN
50.0000 ug | Freq: Once | INTRAMUSCULAR | Status: AC
  Administered 2013-09-30: 50 ug via INTRAVENOUS

## 2013-09-30 MED ORDER — PROMETHAZINE HCL 25 MG/ML IJ SOLN
6.2500 mg | Freq: Once | INTRAMUSCULAR | Status: AC
  Administered 2013-09-30: 6.25 mg via INTRAVENOUS

## 2013-09-30 MED ORDER — SCOPOLAMINE 1 MG/3DAYS TD PT72
1.0000 | MEDICATED_PATCH | TRANSDERMAL | Status: DC
  Administered 2013-09-30: 1.5 mg via TRANSDERMAL

## 2013-09-30 MED ORDER — OXYCODONE HCL 5 MG/5ML PO SOLN
5.0000 mg | Freq: Once | ORAL | Status: AC | PRN
  Administered 2013-09-30: 5 mg via ORAL

## 2013-09-30 MED ORDER — PROMETHAZINE HCL 25 MG/ML IJ SOLN
INTRAMUSCULAR | Status: AC
  Administered 2013-09-30: 6.25 mg via INTRAVENOUS
  Filled 2013-09-30: qty 1

## 2013-09-30 MED ORDER — ROCURONIUM BROMIDE 100 MG/10ML IV SOLN
INTRAVENOUS | Status: DC | PRN
  Administered 2013-09-30: 50 mg via INTRAVENOUS

## 2013-09-30 MED ORDER — INFLUENZA VAC SPLIT QUAD 0.5 ML IM SUSY
0.5000 mL | PREFILLED_SYRINGE | INTRAMUSCULAR | Status: AC
  Administered 2013-10-01: 0.5 mL via INTRAMUSCULAR
  Filled 2013-09-30: qty 0.5

## 2013-09-30 MED ORDER — OXYCODONE-ACETAMINOPHEN 5-325 MG PO TABS
1.0000 | ORAL_TABLET | ORAL | Status: DC | PRN
  Administered 2013-10-01 (×3): 2 via ORAL
  Filled 2013-09-30 (×3): qty 2

## 2013-09-30 MED ORDER — NEOSTIGMINE METHYLSULFATE 10 MG/10ML IV SOLN
INTRAVENOUS | Status: DC | PRN
  Administered 2013-09-30: 3 mg via INTRAVENOUS

## 2013-09-30 MED ORDER — ONDANSETRON HCL 4 MG/2ML IJ SOLN
INTRAMUSCULAR | Status: DC | PRN
  Administered 2013-09-30: 4 mg via INTRAVENOUS

## 2013-09-30 MED ORDER — SENNOSIDES-DOCUSATE SODIUM 8.6-50 MG PO TABS
1.0000 | ORAL_TABLET | Freq: Every evening | ORAL | Status: DC | PRN
  Filled 2013-09-30: qty 1

## 2013-09-30 MED ORDER — DEXAMETHASONE SODIUM PHOSPHATE 4 MG/ML IJ SOLN
INTRAMUSCULAR | Status: AC
  Filled 2013-09-30: qty 1

## 2013-09-30 MED ORDER — HYDROCODONE-ACETAMINOPHEN 5-325 MG PO TABS: 1.0000 | ORAL_TABLET | ORAL | Status: DC | PRN

## 2013-09-30 MED ORDER — MENTHOL 3 MG MT LOZG: 1.0000 | LOZENGE | OROMUCOSAL | Status: DC | PRN

## 2013-09-30 MED ORDER — GLYCOPYRROLATE 0.2 MG/ML IJ SOLN
INTRAMUSCULAR | Status: DC | PRN
  Administered 2013-09-30: 0.2 mg via INTRAVENOUS
  Administered 2013-09-30: 0.4 mg via INTRAVENOUS

## 2013-09-30 MED ORDER — ZOLPIDEM TARTRATE 5 MG PO TABS: 5.0000 mg | ORAL_TABLET | Freq: Every evening | ORAL | Status: DC | PRN

## 2013-09-30 MED ORDER — SODIUM CHLORIDE 0.9 % IJ SOLN: 3.0000 mL | INTRAMUSCULAR | Status: DC | PRN

## 2013-09-30 SURGICAL SUPPLY — 61 items
ADH SKN CLS APL DERMABOND .7 (GAUZE/BANDAGES/DRESSINGS) ×1
APL SKNCLS STERI-STRIP NONHPOA (GAUZE/BANDAGES/DRESSINGS) ×1
BENZOIN TINCTURE PRP APPL 2/3 (GAUZE/BANDAGES/DRESSINGS) ×3 IMPLANT
BIT DRILL SKYLINE 12MM (BIT) ×1 IMPLANT
BLADE SURG ROTATE 9660 (MISCELLANEOUS) IMPLANT
BONE CERV LORDOTIC 14.5X12X6 (Bone Implant) ×6 IMPLANT
BUR ROUND FLUTED 4 SOFT TCH (BURR) ×2 IMPLANT
BUR ROUND FLUTED 4MM SOFT TCH (BURR) ×1
CLOSURE STERI-STRIP 1/2X4 (GAUZE/BANDAGES/DRESSINGS) ×1
CLSR STERI-STRIP ANTIMIC 1/2X4 (GAUZE/BANDAGES/DRESSINGS) ×1 IMPLANT
COLLAR CERV LO CONTOUR FIRM DE (SOFTGOODS) IMPLANT
CORDS BIPOLAR (ELECTRODE) ×3 IMPLANT
COVER SURGICAL LIGHT HANDLE (MISCELLANEOUS) ×3 IMPLANT
CRADLE DONUT ADULT HEAD (MISCELLANEOUS) ×3 IMPLANT
DERMABOND ADVANCED (GAUZE/BANDAGES/DRESSINGS) ×2
DERMABOND ADVANCED .7 DNX12 (GAUZE/BANDAGES/DRESSINGS) ×1 IMPLANT
DRAPE C-ARM 42X72 X-RAY (DRAPES) ×3 IMPLANT
DRAPE MICROSCOPE LEICA (MISCELLANEOUS) ×3 IMPLANT
DRAPE PROXIMA HALF (DRAPES) ×3 IMPLANT
DRILL BIT SKYLINE 12MM (BIT) ×3
DRSG MEPILEX BORDER 4X4 (GAUZE/BANDAGES/DRESSINGS) ×2 IMPLANT
DRSG MEPILEX BORDER 4X8 (GAUZE/BANDAGES/DRESSINGS) ×3 IMPLANT
DURAPREP 6ML APPLICATOR 50/CS (WOUND CARE) ×3 IMPLANT
ELECT COATED BLADE 2.86 ST (ELECTRODE) ×3 IMPLANT
ELECT REM PT RETURN 9FT ADLT (ELECTROSURGICAL) ×3
ELECTRODE REM PT RTRN 9FT ADLT (ELECTROSURGICAL) ×1 IMPLANT
EVACUATOR 1/8 PVC DRAIN (DRAIN) ×3 IMPLANT
GAUZE SPONGE 4X4 12PLY STRL (GAUZE/BANDAGES/DRESSINGS) ×3 IMPLANT
GAUZE XEROFORM 1X8 LF (GAUZE/BANDAGES/DRESSINGS) IMPLANT
GLOVE BIOGEL PI IND STRL 7.5 (GLOVE) ×1 IMPLANT
GLOVE BIOGEL PI IND STRL 8 (GLOVE) ×1 IMPLANT
GLOVE BIOGEL PI INDICATOR 7.5 (GLOVE) ×2
GLOVE BIOGEL PI INDICATOR 8 (GLOVE) ×2
GLOVE ECLIPSE 7.0 STRL STRAW (GLOVE) ×3 IMPLANT
GLOVE ORTHO TXT STRL SZ7.5 (GLOVE) ×3 IMPLANT
GOWN STRL REUS W/ TWL LRG LVL3 (GOWN DISPOSABLE) ×2 IMPLANT
GOWN STRL REUS W/ TWL XL LVL3 (GOWN DISPOSABLE) ×1 IMPLANT
GOWN STRL REUS W/TWL LRG LVL3 (GOWN DISPOSABLE) ×6
GOWN STRL REUS W/TWL XL LVL3 (GOWN DISPOSABLE) ×3
HEAD HALTER (SOFTGOODS) ×3 IMPLANT
HEMOSTAT SURGICEL 2X14 (HEMOSTASIS) IMPLANT
KIT BASIN OR (CUSTOM PROCEDURE TRAY) ×3 IMPLANT
KIT ROOM TURNOVER OR (KITS) ×3 IMPLANT
MANIFOLD NEPTUNE II (INSTRUMENTS) IMPLANT
NEEDLE 25GX 5/8IN NON SAFETY (NEEDLE) ×3 IMPLANT
NS IRRIG 1000ML POUR BTL (IV SOLUTION) ×3 IMPLANT
PACK ORTHO CERVICAL (CUSTOM PROCEDURE TRAY) ×3 IMPLANT
PAD ARMBOARD 7.5X6 YLW CONV (MISCELLANEOUS) ×6 IMPLANT
PATTIES SURGICAL .5 X.5 (GAUZE/BANDAGES/DRESSINGS) IMPLANT
PLATE SKYLINE TWO LEVEL 28MM (Plate) ×3 IMPLANT
RESTRAINT LIMB HOLDER UNIV (RESTRAINTS) ×3 IMPLANT
SCREW VARIABLE SELF TAP 12MM (Screw) ×6 IMPLANT
SURGIFLO TRUKIT (HEMOSTASIS) IMPLANT
SUT BONE WAX W31G (SUTURE) ×3 IMPLANT
SUT VIC AB 3-0 X1 27 (SUTURE) ×3 IMPLANT
SUT VICRYL 4-0 PS2 18IN ABS (SUTURE) ×6 IMPLANT
SYR 30ML SLIP (SYRINGE) ×3 IMPLANT
TOWEL OR 17X24 6PK STRL BLUE (TOWEL DISPOSABLE) ×3 IMPLANT
TOWEL OR 17X26 10 PK STRL BLUE (TOWEL DISPOSABLE) ×3 IMPLANT
TRAY FOLEY CATH 16FR SILVER (SET/KITS/TRAYS/PACK) IMPLANT
WATER STERILE IRR 1000ML POUR (IV SOLUTION) ×3 IMPLANT

## 2013-09-30 NOTE — Progress Notes (Signed)
Pt reports eating 1 Cheerio to get morning med down at 0900. Dr. Randa Evens informed and told this nurse that she would call Dr. Ophelia Charter, will monitor.

## 2013-09-30 NOTE — Interval H&P Note (Signed)
History and Physical Interval Note:  09/30/2013 12:51 PM  [name]  has presented today for surgery, with the diagnosis of C5-6, C6-7 Spondylosis, HNP  The various methods of treatment have been discussed with the patient and family. After consideration of risks, benefits and other options for treatment, the patient has consented to  Procedure(s): C5-6, C6-7 Anterior Cervical Discectomy and Fusion, Allograft, Plate (N/A) as a surgical intervention .  The patient's history has been reviewed, patient examined, no change in status, stable for surgery.  I have reviewed the patient's chart and labs.  Questions were answered to the patient's satisfaction.     [name] C

## 2013-09-30 NOTE — Transfer of Care (Signed)
Immediate Anesthesia Transfer of Care Note  Patient: [name]  Procedure(s) Performed: Procedure(s): C5-6, C6-7 Anterior Cervical Discectomy and Fusion, Allograft, Plate (N/A)  Patient Location: PACU  Anesthesia Type:General  Level of Consciousness: awake, alert  and oriented  Airway & Oxygen Therapy: Patient Spontanous Breathing and Patient connected to nasal cannula oxygen  Post-op Assessment: Report given to PACU RN and Post -op Vital signs reviewed and stable  Post vital signs: Reviewed and stable  Complications: No apparent anesthesia complications

## 2013-09-30 NOTE — Anesthesia Preprocedure Evaluation (Signed)
Anesthesia Evaluation  Patient identified by MRN, date of birth, ID band Patient awake    Reviewed: Allergy & Precautions, H&P , NPO status , Patient's Chart, lab work & pertinent test results  History of Anesthesia Complications (+) PONV and history of anesthetic complications  Airway Mallampati: II TM Distance: >3 FB Neck ROM: Full    Dental  (+) Teeth Intact, Dental Advisory Given   Pulmonary Current Smoker,  breath sounds clear to auscultation        Cardiovascular hypertension, Rhythm:Regular Rate:Normal     Neuro/Psych  Headaches, Anxiety    GI/Hepatic Neg liver ROS, GERD-  ,  Endo/Other  diabetes, Well Controlled, Type obesity  Renal/GU negative Renal ROS  negative genitourinary   Musculoskeletal  (+) Arthritis -,   Abdominal   Peds  Hematology   Anesthesia Other Findings   Reproductive/Obstetrics negative OB ROS                           Anesthesia Physical Anesthesia Plan  ASA: III  Anesthesia Plan: General   Post-op Pain Management:    Induction: Intravenous  Airway Management Planned: Oral ETT  Additional Equipment:   Intra-op Plan:   Post-operative Plan: Extubation in OR  Informed Consent: I have reviewed the patients History and Physical, chart, labs and discussed the procedure including the risks, benefits and alternatives for the proposed anesthesia with the patient or authorized representative who has indicated his/her understanding and acceptance.   Dental advisory given  Plan Discussed with: CRNA, Anesthesiologist and Surgeon  Anesthesia Plan Comments:         Anesthesia Quick Evaluation

## 2013-09-30 NOTE — Anesthesia Procedure Notes (Signed)
Procedure Name: Intubation Date/Time: 09/30/2013 3:48 PM Performed by: Leonel Ramsay Pre-anesthesia Checklist: Patient identified, Timeout performed, Emergency Drugs available, Suction available and Patient being monitored Patient Re-evaluated:Patient Re-evaluated prior to inductionOxygen Delivery Method: Circle system utilized Preoxygenation: Pre-oxygenation with 100% oxygen Intubation Type: IV induction Ventilation: Mask ventilation without difficulty Laryngoscope Size: Mac and 3 Grade View: Grade I Tube type: Oral Tube size: 7.0 mm Number of attempts: 1 Airway Equipment and Method: Stylet Placement Confirmation: ETT inserted through vocal cords under direct vision,  positive ETCO2 and breath sounds checked- equal and bilateral Secured at: 22 cm Tube secured with: Tape Dental Injury: Teeth and Oropharynx as per pre-operative assessment

## 2013-09-30 NOTE — Brief Op Note (Signed)
09/30/2013  6:19 PM  PATIENT:  [name]  48 y.o. female  PRE-OPERATIVE DIAGNOSIS:  C5-6, C6-7 Spondylosis, HNP  POST-OPERATIVE DIAGNOSIS:  C5-6, C6-7 Spondylosis, HNP  PROCEDURE:  Procedure(s): C5-6, C6-7 Anterior Cervical Discectomy and Fusion, Allograft, Plate (N/A)  SURGEON:  Surgeon(s) and Role:    * Eldred Manges, MD - Primary  PHYSICIAN ASSISTANT: Maud Deed PA-C  ASSISTANTS: none   ANESTHESIA:   local and general  EBL:  Total I/O In: 1200 [I.V.:1200] Out: 50 [Blood:50]  BLOOD ADMINISTERED:none  DRAINS: HV drain  LOCAL MEDICATIONS USED:  MARCAINE     SPECIMEN:  No Specimen  DISPOSITION OF SPECIMEN:  N/A  COUNTS:  YES  TOURNIQUET:  * No tourniquets in log *  DICTATION: .Other Dictation: Dictation Number [id number]  PLAN OF CARE: Admit for overnight observation  PATIENT DISPOSITION:  PACU - hemodynamically stable.   Delay start of Pharmacological VTE agent (>24hrs) due to surgical blood loss or risk of bleeding: not applicable

## 2013-09-30 NOTE — Progress Notes (Signed)
Per Dr. Randa Evens, pt's surgery will be moved to a later time today per Dr. Ophelia Charter. Pt also verbalized understanding.

## 2013-09-30 NOTE — Anesthesia Postprocedure Evaluation (Signed)
  Anesthesia Post-op Note  Patient: [name]  Procedure(s) Performed: Procedure(s): C5-6, C6-7 Anterior Cervical Discectomy and Fusion, Allograft, Plate (N/A)  Patient Location: PACU  Anesthesia Type:General  Level of Consciousness: awake and alert   Airway and Oxygen Therapy: Patient Spontanous Breathing  Post-op Pain: mild  Post-op Assessment: Post-op Vital signs reviewed, Patient's Cardiovascular Status Stable and Respiratory Function Stable  Post-op Vital Signs: Reviewed  Filed Vitals:   09/30/13 2000  BP:   Pulse: 99  Temp: 36.5 C  Resp: 28    Complications: No apparent anesthesia complications

## 2013-09-30 NOTE — Plan of Care (Signed)
Problem: Consults Goal: Diagnosis - Spinal Surgery Outcome: Completed/Met Date Met:  09/30/13 Cervical Spine Fusion

## 2013-10-01 ENCOUNTER — Encounter (HOSPITAL_COMMUNITY): Payer: Self-pay | Admitting: Orthopaedic Surgery

## 2013-10-01 DIAGNOSIS — M47812 Spondylosis without myelopathy or radiculopathy, cervical region: Secondary | ICD-10-CM | POA: Diagnosis not present

## 2013-10-01 DIAGNOSIS — N390 Urinary tract infection, site not specified: Secondary | ICD-10-CM | POA: Diagnosis present

## 2013-10-01 LAB — GLUCOSE, CAPILLARY: Glucose-Capillary: 100 mg/dL — ABNORMAL HIGH (ref 70–99)

## 2013-10-01 NOTE — Progress Notes (Signed)
Orthopedic Tech Progress Note Patient Details:  [name] 07/17/1965 [id number] Delivered for home use Ortho Devices Type of Ortho Device: Soft collar Ortho Device/Splint Interventions: Ordered   Asia Burnett Kanaris 10/01/2013, 9:35 AM

## 2013-10-01 NOTE — Progress Notes (Signed)
Pt doing well. Pt and husband given D/C instructions with Rx's, verbal understanding was provided. Pt's incision was covered with mepliex dressing and had no sign of infection. Pt's IV and hemovac were removed prior to D/C. Pt D/C'd home via wheelchair @ 1430 per MD order. Pt received additional soft collar for home use per MD order prior to D/C. Pt was stable @ D/C and has no other needs at this time. Rema Fendt, RN

## 2013-10-01 NOTE — Progress Notes (Signed)
Subjective: 1 Day Post-Op Procedure(s) (LRB): C5-6, C6-7 Anterior Cervical Discectomy and Fusion, Allograft, Plate (N/A) Patient reports pain as mild.    Objective: Vital signs in last 24 hours: Temp:  [97.7 F (36.5 C)-98.9 F (37.2 C)] 98.9 F (37.2 C) (09/17 0808) Pulse Rate:  [65-99] 78 (09/17 0808) Resp:  [10-28] 18 (09/17 0808) BP: (93-144)/(49-79) 93/51 mmHg (09/17 0808) SpO2:  [94 %-99 %] 95 % (09/17 0808) Weight:  [97.07 kg (214 lb)] 97.07 kg (214 lb) (09/16 1050)  Intake/Output from previous day: 09/16 0701 - 09/17 0700 In: 1225 [I.V.:1200] Out: 520 [Urine:450; Drains:20; Blood:50] Intake/Output this shift:     Recent Labs  09/28/13 1350  HGB 14.2    Recent Labs  09/28/13 1350  WBC 4.6  RBC 4.58  HCT 41.7  PLT 252    Recent Labs  09/28/13 1350  NA 139  K 4.1  CL 102  CO2 25  BUN 10  CREATININE 0.65  GLUCOSE 88  CALCIUM 9.1    Recent Labs  09/28/13 1350  INR 1.04    Neurovascular intact Incision: drain removed without difficulty no drainage.  Assessment/Plan: 1 Day Post-Op Procedure(s) (LRB): C5-6, C6-7 Anterior Cervical Discectomy and Fusion, Allograft, Plate (N/A) Advance diet Discharge home today Pt will continue Cipro for UTI Soft collar at all times. rx on chart.  Sabre Leonetti M 10/01/2013, 8:41 AM

## 2013-10-01 NOTE — Progress Notes (Signed)
Utilization Review Completed.[name] T9/17/2015  

## 2013-10-01 NOTE — Discharge Instructions (Signed)
No lifting greater than 10 lbs. No overhead use of arms. Avoid bending,and twisting neck. Walk in house for first week them may start to get out slowly increasing distance up to one mile by 3 weeks post op. Keep incision dry for 3 days, may then bathe and wet incision using a covered collar when showering. Change dressing daily or as needed. Ice packs as needed for pain and swelling. Call if any fevers >101, chills, or increasing numbness or weakness or increased swelling or drainage.      Anterior and Posterior Decompressions with Fusions Decompression is a procedure performed to relieve symptoms caused by pressure or compression on the spinal cord and nerve roots. The spinal cord is a cord of nervous tissue. It extends from the brain and passes through the spinal canal (passage formed by the openings in the backbone). Nerves branch out from the spinal cord to different parts of the body. Vertebrae (backbones) are a group of individual bones. They form the spinal column. Each vertebra is made up of lamina (bony arches of the spinal canal), spine, and foramen (opening in the backbone). A disc is a soft, gel-like cushion between each vertebra.  Decompression can be carried out as an anterior or posterior procedure. Anterior decompression is where the surgeon makes an incision more toward the front of your body in order to get access to the area needing repair. Posterior decompression is where the surgeon makes an incision more toward the back of your body in order to get access to the area needing repair. Depending upon the details of your specific condition, there are reasons why the surgeon may choose one approach over the other. CAUSES Compression of spinal cord and nerves can be caused by:  Bulging or collapse of the spinal disc.  Loosening of the ligaments.  Bony growth. The causes of the spinal cord compression include:  Degenerative diseases (such as with many arthritis  problems).  Rupture of the disc.  Traumatic injury.  Spinal stenosis (narrowing of the spinal canal).  Pott disease (tuberculosis of the spine). LET YOUR CAREGIVER KNOW ABOUT:   Bleeding and clotting problems.  Allergy to anesthesia medications.  Allergy to medications like steroid and blood thinners.  Previous surgeries.  Bone diseases like disease of low bone mass (osteoporosis) and infection of bone (osteomyelitis).  Cancerous (neoplastic) conditions. RISKS AND COMPLICATIONS  Bleeding and collection of blood outside the blood vessels.  Damage to the blood vessels. This can cause heavy bleeding and even death.  Damage to the nerves. This can cause pain, loss of sensation, paralysis, and weakness.  Damage to the covering of the spinal cord (meninges). This can cause the cerebrospinal fluid to leak.  Complications involving graft and plate.  Inadequate fusion.  Wound infection. BEFORE THE PROCEDURE  Your caregiver will make sure that you have been given a reasonable trial of nonsurgical treatment methods. Your caregiver may perform an X-ray study with a dye (diskogram). The injection of a dye into a disc may produce a pain similar to your back pain. This will help your caregiver to identify the disc that is the source of pain.  PROCEDURE Your surgeon will perform spinal decompression and fusion while you are under general anesthesia. There are different methods of performing decompression surgery. They include the following:  Diskectomy. Your surgeon will remove a portion of a spinal disc that is causing pressure on the nerve roots.  Laminotomy or laminectomy. Lamina refers to bony arches of the spinal canal. Laminotomy  is a procedure in which your surgeon removes a small portion of lamina. Laminectomy is the removal of an entire lamina.  Foraminotomy or foraminectomy. Foramen refers to the opening in the backbone for nerve roots. Foraminotomy is the removal of a small  amount of bone and tissue forming the foramen. Foraminectomy is the removal of a large amount of bone and tissue.  Osteophyte removal. Your surgeon removes bony growths called osteophytes. These cause pressure on the spinal column.  Corpectomy. Corpus refers to the body of vertebra. This procedure involves the removal of the body of a vertebra and also the discs.  Spinal fusion is required if there is a curvature of the spine or forward slip of a vertebra. Spinal fusion is a procedure of fusing two or more vertebrae together with a bone graft (new bone or substitute material from your body). This is further strengthened by using screws, plate, and cage apparatus.  Fusion prevents motion between 2 vertebrae. It also prevents the curvature of the spine and slip of vertebra from getting worse after surgery. AFTER THE PROCEDURE   You will be moved to the recovery room and then to the hospital room.  You will have a thin tube (catheter) inserted into the urinary bladder to help in urination.  Your may have pain for the first few days after the surgery. Your caregiver will give you medications to control pain.  Your caregiver may order blood tests to monitor oxygen carrying protein of the red blood cells (hemoglobin). This would be due to blood loss during the surgery. Hemoglobin should be monitored to make sure that the level of blood oxygen does not become too low.  You will be given a back brace. A back brace limits motion and helps fusion of the bone.  You may continue to have mild pain even after full recovery.  You may have a series X-ray examinations over time. This will ensure adequate healing and appropriate alignment at the site of operation. HOME CARE INSTRUCTIONS   To get strength and function, start physical therapy, occupational therapy, and exercises.  To ease the pain, you may have to exercise regularly. That also helps in weight loss.  To keep your spine in proper alignment,  you should sit, stand, walk, turn in bed, and reposition yourself as instructed.  At first, take only short walks. Slowly increase other activities.  Avoid smoking. Nicotine inhibits fusion of the bone.  If narcotics (pain medication) are prescribed, you should not drink alcohol. You should not drive when you are on this medication because you will feel drowsy.  Avoid use of pain medication products and nonsteroidal anti-inflammatory agents (pain medication). They interfere with the development and growth of new bone cells.  Your caregiver may recommend using ice to manage pain. Ask your caregiver for instructions on how to do it.  Avoid lifting heavy objects.  Avoid bending and twisting motions. SEEK MEDICAL CARE IF:   You have increased pain.  There is expanding redness at the operative site.  You have a fever. SEEK IMMEDIATE MEDICAL CARE IF:   You have any discomfort with the substitute material that has been used during the spinal fusion procedure.  You feel a collection of blood outside the blood vessels.  You notice any changes in the smell, appearance, or amount of drainage. Document Released: 01/21/2007 Document Revised: 05/18/2013 Document Reviewed: 05/24/2007 [facility] Patient Information 2015 [place], [place]. This information is not intended to replace advice given to you by your health  care provider. Make sure you discuss any questions you have with your health care provider.

## 2013-10-01 NOTE — Op Note (Signed)
[name], [name]                ACCOUNT NO.:  [id number]  MEDICAL RECORD NO.:  [id number]  LOCATION:  3C02C                        FACILITY:  [facility]  PHYSICIAN:  Mark C. Ophelia Charter, M.D.    DATE OF BIRTH:  1965/10/11  DATE OF PROCEDURE:  09/30/2013 DATE OF DISCHARGE:                              OPERATIVE REPORT   PREOPERATIVE DIAGNOSES:  Cervical spondylosis with herniated nucleus pulposus and cervical stenosis at C5-6 and C6-7.  POSTOPERATIVE DIAGNOSES:  Cervical spondylosis with herniated nucleus pulposus and cervical stenosis at C5-6 and C6-7.  PROCEDURES:  C5-6 and C6-7 anterior cervical diskectomy and fusion, allograft and plate.  SURGEON:  Mark C. Ophelia Charter, M.D.  ASSISTANT:  Maud Deed, PA-C, medically necessary and present for the entire procedure.  ESTIMATED BLOOD LOSS:  Less than 100 mL.  DRAINS:  One Hemovac, neck.  COMPLICATIONS:  None.  IMPLANTS:  DePuy Skyline plate with 16-XW screws x6.  OPERATIVE PROCEDURE:  After induction of general anesthesia and orotracheal intubation, arms were tucked at the side.  The patient had large chest and tape was applied to help with exposure to her short neck.  Head halter traction was applied without weight.  Neck was prepped with DuraPrep.  The area was squared with towels, sterile skin marker used on prominent skin fold, appropriate level based on cricothyroid palpation, carotid tubercle and clavicle distance. Betadine Steri-Drape applied, sterile Mayo stand at the head, thyroid sheets and drapes.  Time-out procedure completed.  Ancef was not given due to her allergies and she was given clindamycin.  After induction of general anesthesia and orotracheal intubation, prepping and draping as above, incision was made at the midline extending to the left.  Platysma was divided with the skin incision and blunt dissection below the omohyoid down to palpable spurs at C5-6 and C6-7.  Short 25-needle placed at C5-6, confirmed with  the lateral C-arm. Cloward retractor was placed.  C5-6 diskectomy was performed first. Anterior spurs removed at C5-6 and C6-7.  Diskectomy was performed. There was extruded disk.  Operative microscope was used to remove fragments, piece of the black nerve hook.  Micropituitary was used to remove them, some pieces were suctioned.  Posterior longitudinal ligament was completely removed.  A 6-mm graft gave a nice tight fit. There was room on each side for egress of fluid.  Uncovertebral joints were stripped and disk material, which was broad-based, worst compression on the left was removed and decompressed.  The operative field was dry.  Bone graft was marked anteriorly, countersunk 2 mm. Identical procedure repeated at C6-7.  At this level, there was more disk extruded, all retained by the posterior longitudinal ligament on the left side and chunks of disks were teased out, removed.  Dura was visualized all the way across the entire field.  Again, a 6-mm graft gave a nice tight fit.  It was inserted by CRNA, pulling on head halter traction, countersunk 2 mm.  Spurs were removed with the bur anteriorly. A 20-mm plate was selected.  Screws were inserted by hand after C-arm was used to check position of the plate, make sure it was centered appropriately.  Neck looked straight on AP plane and  screws were in good position on lateral.  All six screws were inserted, checked under C-arm again.  Once final films were seen, screws were locked down with the locking screw.  After irrigation of the operative field, Hemovac placed in line with in-and-out technique.  Closure of platysma with 3-0 Vicryl, 4-0 Vicryl subcuticular closure.  Tincture of benzoin, Steri-Strips, and postop Mepilex dressing and soft cervical collar with the collar extender.     Mark C. Ophelia Charter, M.D.     MCY/MEDQ  D:  09/30/2013  T:  10/01/2013  Job:  [id number]

## 2013-10-12 NOTE — Discharge Summary (Signed)
Physician Discharge Summary  Patient ID: [name] MRN: [id number] DOB/AGE: 1965/10/18 48 y.o.  Admit date: 09/30/2013 Discharge date: 10/01/2013  Admission Diagnoses:  Spinal stenosis in cervical region C5-6 and C6-7  Discharge Diagnoses:  Principal Problem:   Spinal stenosis in cervical region Active Problems:   HNP (herniated nucleus pulposus), cervical   Infection of urinary tract   Past Medical History  Diagnosis Date  . Migraines   . GERD (gastroesophageal reflux disease)   . Hyperlipidemia   . Trouble swallowing   . Constipation   . Diarrhea   . Fatigue   . Migraines   . Vertigo 01-23-11    recent -took Meclicine-now improved  . Nausea and vomiting 01-23-11    lap band being coverted to Gastric bypass  . Hypertension     gestational  . Diabetes mellitus     Prediabetes when pregnant (gestational)  . PONV (postoperative nausea and vomiting)     -PONV  . Anxiety     mild  . Arthritis     Surgeries: Procedure(s): C5-6, C6-7 Anterior Cervical Discectomy and Fusion, Allograft, Plate on 5/62/1308   Consultants (if any):  none  Discharged Condition: Improved  Hospital Course: [name] is an 48 y.o. female who was admitted 09/30/2013 with a diagnosis of Spinal stenosis in cervical region and went to the operating room on 09/30/2013 and underwent the above named procedures.    She was given perioperative antibiotics:  Anti-infectives   Start     Dose/Rate Route Frequency Ordered Stop   09/30/13 2200  ciprofloxacin (CIPRO) tablet 500 mg  Status:  Discontinued     500 mg Oral 2 times daily 09/30/13 2031 10/01/13 1815   09/30/13 2200  ceFAZolin (ANCEF) IVPB 1 g/50 mL premix     1 g 100 mL/hr over 30 Minutes Intravenous Every 8 hours 09/30/13 2031 10/01/13 0546   09/30/13 0600  ceFAZolin (ANCEF) IVPB 2 g/50 mL premix     2 g 100 mL/hr over 30 Minutes Intravenous On call to O.R. 09/29/13 1414 09/30/13 1600    Pt remained on Cipro for UTI.  She was given  sequential compression devices, early ambulation for DVT prophylaxis.  She benefited maximally from the hospital stay and there were no complications.    Recent vital signs:  Filed Vitals:   10/01/13 1200  BP: 93/38  Pulse: 75  Temp: 98.2 F (36.8 C)  Resp: 18    Recent laboratory studies:  Lab Results  Component Value Date   HGB 14.2 09/28/2013   HGB 14.6 08/09/2011   HGB 10.3* 01/31/2011   Lab Results  Component Value Date   WBC 4.6 09/28/2013   PLT 252 09/28/2013   Lab Results  Component Value Date   INR 1.04 09/28/2013   Lab Results  Component Value Date   NA 139 09/28/2013   K 4.1 09/28/2013   CL 102 09/28/2013   CO2 25 09/28/2013   BUN 10 09/28/2013   CREATININE 0.65 09/28/2013   GLUCOSE 88 09/28/2013    Discharge Medications:     Medication List    STOP taking these medications       acetaminophen 500 MG tablet  Commonly known as:  TYLENOL      TAKE these medications       ciprofloxacin 500 MG tablet  Commonly known as:  CIPRO  Take 500 mg by mouth 2 (two) times daily.     ibuprofen 200 MG tablet  Commonly known as:  ADVIL,MOTRIN  Take 800 mg by mouth every 6 (six) hours as needed.     methocarbamol 500 MG tablet  Commonly known as:  ROBAXIN  Take 1 tablet (500 mg total) by mouth every 6 (six) hours as needed for muscle spasms (spasm).     oxyCODONE-acetaminophen 5-325 MG per tablet  Commonly known as:  ROXICET  Take 1-2 tablets by mouth every 4 (four) hours as needed.     traMADol 50 MG tablet  Commonly known as:  ULTRAM  Take 50 mg by mouth every 6 (six) hours as needed.        Diagnostic Studies: Dg Chest 2 View  09/28/2013   CLINICAL DATA:  preop cervical spondylosis  EXAM: CHEST - 2 VIEW  COMPARISON:  None.  FINDINGS: Mild bronchitic and interstitial prominence. Normal heart size and vascularity. Lungs remain clear. No focal pneumonia, collapse or consolidation. No edema, effusion or pneumothorax. Trachea midline.  IMPRESSION: Low volume  exam.  No acute finding.   Electronically Signed   By: Ruel Favors M.D.   On: 09/28/2013 14:52   Dg Cervical Spine 2-3 Views  09/30/2013   CLINICAL DATA:  Cervical disc protrusions.  EXAM: CERVICAL SPINE - 2-3 VIEW; DG C-ARM 61-120 MIN  COMPARISON:  MRI dated 09/01/2013  FINDINGS: AP and lateral C-arm images demonstrate the patient has undergone anterior cervical fusion at C5-6 and C6-7. Anterior plate and screws and plugs appear in good position. Alignment is anatomic.  IMPRESSION: Anterior cervical fusions performed at C5-6 and C6-7.   Electronically Signed   By: Geanie Cooley M.D.   On: 09/30/2013 20:02   Dg C-arm 1-60 Min  09/30/2013   CLINICAL DATA:  Cervical disc protrusions.  EXAM: CERVICAL SPINE - 2-3 VIEW; DG C-ARM 61-120 MIN  COMPARISON:  MRI dated 09/01/2013  FINDINGS: AP and lateral C-arm images demonstrate the patient has undergone anterior cervical fusion at C5-6 and C6-7. Anterior plate and screws and plugs appear in good position. Alignment is anatomic.  IMPRESSION: Anterior cervical fusions performed at C5-6 and C6-7.   Electronically Signed   By: Geanie Cooley M.D.   On: 09/30/2013 20:02    Disposition: 01-Home or Self Care      Discharge Instructions   Call MD / Call 911    Complete by:  As directed   If you experience chest pain or shortness of breath, CALL 911 and be transported to the hospital emergency room.  If you develope a fever above 101 F, pus (white drainage) or increased drainage or redness at the wound, or calf pain, call your surgeon's office.     Constipation Prevention    Complete by:  As directed   Drink plenty of fluids.  Prune juice may be helpful.  You may use a stool softener, such as Colace (over the counter) 100 mg twice a day.  Use MiraLax (over the counter) for constipation as needed.     Diet - low sodium heart healthy    Complete by:  As directed      Discharge instructions    Complete by:  As directed   No lifting greater than 10 lbs. No overhead  use of arms. Avoid bending,and twisting neck. Walk in house for first week them may start to get out slowly increasing distance up to one mile by 3 weeks post op. Keep incision dry for 3 days, may then bathe and wet incision using a covered collar when showering. Call if any fevers >101, chills,  or increasing numbness or weakness or increased swelling or drainage.     Driving restrictions    Complete by:  As directed   No driving     Increase activity slowly as tolerated    Complete by:  As directed      Lifting restrictions    Complete by:  As directed   No lifting           Follow-up Information   Follow up with YATES,MARK C, MD. Schedule an appointment as soon as possible for a visit in 1 week.   Specialty:  Orthopedic Surgery   Contact information:   [address] [place] [address] [phone]        Signed: Wende Neighbors 10/12/2013, 11:21 AM

## 2016-01-18 ENCOUNTER — Telehealth (INDEPENDENT_AMBULATORY_CARE_PROVIDER_SITE_OTHER): Payer: Self-pay | Admitting: Orthopaedic Surgery

## 2016-01-18 NOTE — Telephone Encounter (Signed)
Patient has appt in system to see Dr. Roda ShuttersXu on 01/23/16

## 2016-01-23 ENCOUNTER — Ambulatory Visit (INDEPENDENT_AMBULATORY_CARE_PROVIDER_SITE_OTHER): Payer: 59 | Admitting: Orthopaedic Surgery

## 2016-01-23 ENCOUNTER — Encounter (INDEPENDENT_AMBULATORY_CARE_PROVIDER_SITE_OTHER): Payer: Self-pay | Admitting: Orthopaedic Surgery

## 2016-01-23 DIAGNOSIS — M7711 Lateral epicondylitis, right elbow: Secondary | ICD-10-CM | POA: Diagnosis not present

## 2016-01-23 MED ORDER — LIDOCAINE HCL 1 % IJ SOLN
1.0000 mL | INTRAMUSCULAR | Status: AC | PRN
  Administered 2016-01-23: 1 mL

## 2016-01-23 MED ORDER — BUPIVACAINE HCL 0.5 % IJ SOLN
1.0000 mL | INTRAMUSCULAR | Status: AC | PRN
  Administered 2016-01-23: 1 mL

## 2016-01-23 MED ORDER — METHYLPREDNISOLONE ACETATE 40 MG/ML IJ SUSP
40.0000 mg | INTRAMUSCULAR | Status: AC | PRN
  Administered 2016-01-23: 40 mg

## 2016-01-23 NOTE — Progress Notes (Signed)
Office Visit Note   Patient: [name]           Date of Birth: 06/27/1965           MRN: [id number] Visit Date: 01/23/2016              Requested by: Lavina Hamman, MD [address] [place], [place] [address] PCP: Zenaida Niece, MD   Assessment & Plan: Visit Diagnoses:  1. Lateral epicondylitis, right elbow     Plan: Ashen is right lateral epicondylitis. Injection was performed today under sterile conditions patient tolerates well exercises given also. I'll see her back as needed  Follow-Up Instructions: Return if symptoms worsen or fail to improve.   Orders:  No orders of the defined types were placed in this encounter.  No orders of the defined types were placed in this encounter.     Procedures: Hand/UE Inj Date/Time: 01/23/2016 2:09 PM Performed by: Tarry Kos Authorized by: Tarry Kos   Consent Given by:  Patient Timeout: prior to procedure the correct patient, procedure, and site was verified   Indications:  Pain Condition: lateral epicondylitis   Site:  R elbow Prep: patient was prepped and draped in usual sterile fashion   Needle Size:  25 G Medications:  1 mL lidocaine 1 %; 1 mL bupivacaine 0.5 %; 40 mg methylPREDNISolone acetate 40 MG/ML     Clinical Data: No additional findings.   Subjective: Chief Complaint  Patient presents with  . Right Elbow - Pain    Patient is a 51 year old female comes in with right elbow pain for 3 weeks that radiates down the arm. It started when she picked up something heavy. She denies any neck pain or radicular symptoms of pain is worse with use of the arm and better with rest. She has not tried any over-the-counter medicines other than Advil which does provide some partial relief.    Review of Systems Complete review of systems negative except for history of present illness  Objective: Vital Signs: There were no vitals taken for this visit.  Physical Exam Well-developed nourished  acute distress alert 3 nonlabored breathing normal judgments affect admits lymphadenopathy Ortho Exam Exam of the right elbow shows no swelling of the elbow region. She is tender over the lateral epicondyle radial tunnel is slightly tender negative Tinel and radial tunnel no focal motor or sensory deficits and no skin changes Specialty Comments:  No specialty comments available.  Imaging: No results found.   PMFS History: Patient Active Problem List   Diagnosis Date Noted  . Infection of urinary tract 10/01/2013  . Spinal stenosis in cervical region 09/30/2013  . HNP (herniated nucleus pulposus), cervical 09/30/2013  . 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 History of vagotomy 11/23/2010  . History of laparoscopic adjustable gastric banding 11/23/2010   Past Medical History:  Diagnosis Date  . Anxiety    mild  . Arthritis   . Constipation   . Diabetes mellitus    Prediabetes when pregnant (gestational)  . Diarrhea   . Fatigue   . GERD (gastroesophageal reflux disease)   . Hyperlipidemia   . Hypertension    gestational  . Migraines   . Migraines   . Nausea and vomiting 01-23-11   lap band being coverted to Gastric bypass  . PONV (postoperative nausea and vomiting)    -PONV  . Trouble swallowing   . Vertigo 01-23-11  recent -took Meclicine-now improved    Family History  Problem Relation Age of Onset  . Diabetes Mother     Past Surgical History:  Procedure Laterality Date  . ANTERIOR CERVICAL DECOMP/DISCECTOMY FUSION N/A 09/30/2013   Procedure: C5-6, C6-7 Anterior Cervical Discectomy and Fusion, Allograft, Plate;  Surgeon: Eldred MangesMark C Yates, MD;  Location: [facility] OR;  Service: Orthopedics;  Laterality: N/A;  . childbirth  01-23-11   NVD  . GASTRIC ROUX-EN-Y  01/29/2011   Procedure: LAPAROSCOPIC ROUX-EN-Y GASTRIC;  Surgeon: Valarie MerinoMatthew B Martin, MD;  Location: [facility];  Service: General;  Laterality: N/A;  Removal of Lap Band Conversion to Roux  Y Gastric Bypass   . KNEE CARTILAGE SURGERY  2004   right knee  . LAPAROSCOPIC GASTRIC BANDING  11/2004  . TUBAL LIGATION     Social History   Occupational History  . Not on file.   Social History Main Topics  . Smoking status: Current Some Day Smoker    Packs/day: 0.25    Years: 25.00    Last attempt to quit: 10/23/2010  . Smokeless tobacco: Never Used     Comment: started back smoking  . Alcohol use No     Comment: ocasional wine  . Drug use: No  . Sexual activity: Yes

## 2016-01-26 ENCOUNTER — Encounter (HOSPITAL_COMMUNITY): Payer: Self-pay

## 2016-06-25 ENCOUNTER — Ambulatory Visit (INDEPENDENT_AMBULATORY_CARE_PROVIDER_SITE_OTHER): Payer: 59 | Admitting: Orthopaedic Surgery

## 2016-06-25 DIAGNOSIS — M7711 Lateral epicondylitis, right elbow: Secondary | ICD-10-CM | POA: Diagnosis not present

## 2016-06-25 MED ORDER — METHYLPREDNISOLONE ACETATE 40 MG/ML IJ SUSP
40.0000 mg | INTRAMUSCULAR | Status: AC | PRN
  Administered 2016-06-25: 40 mg

## 2016-06-25 MED ORDER — LIDOCAINE HCL 1 % IJ SOLN
1.0000 mL | INTRAMUSCULAR | Status: AC | PRN
  Administered 2016-06-25: 1 mL

## 2016-06-25 MED ORDER — BUPIVACAINE HCL 0.5 % IJ SOLN
1.0000 mL | INTRAMUSCULAR | Status: AC | PRN
  Administered 2016-06-25: 1 mL

## 2016-06-25 NOTE — Progress Notes (Signed)
Office Visit Note   Patient: [name]           Date of Birth: [birth year]           MRN: [id number] Visit Date: 06/25/2016              Requested by: Lavina Hamman, MD [address] [place], [place] [address] PCP: Lavina Hamman, MD   Assessment & Plan: Visit Diagnoses:  1. Lateral epicondylitis, right elbow     Plan: Tennis elbow injection was performed today. I did give her a referral for physical therapy. She does not sound like she is very interested in doing physical therapy. Follow-up with me as needed. If not better may consider advanced imaging.  Follow-Up Instructions: Return if symptoms worsen or fail to improve.   Orders:  No orders of the defined types were placed in this encounter.  No orders of the defined types were placed in this encounter.     Procedures: Hand/UE Inj Date/Time: 06/25/2016 11:15 AM Performed by: Tarry Kos Authorized by: Tarry Kos   Consent Given by:  Patient Timeout: prior to procedure the correct patient, procedure, and site was verified   Indications:  Pain Condition: lateral epicondylitis   Site:  R elbow Prep: patient was prepped and draped in usual sterile fashion   Needle Size:  25 G Medications:  1 mL lidocaine 1 %; 1 mL bupivacaine 0.5 %; 40 mg methylPREDNISolone acetate 40 MG/ML     Clinical Data: No additional findings.   Subjective: Chief Complaint  Patient presents with  . Right Elbow - Pain    Patient is a 51 year old female comes in today for recent worsening of her lateral epicondylitis. She responded very well to the previous injection. She recently had a job where it aggravated it. She denies any acute injuries or symptoms other than just gradual worsening. She is requesting an injection today. She denies any numbness or tingling.    Review of Systems  Constitutional: Negative.   HENT: Negative.   Eyes: Negative.   Respiratory: Negative.   Cardiovascular: Negative.     Endocrine: Negative.   Musculoskeletal: Negative.   Neurological: Negative.   Hematological: Negative.   Psychiatric/Behavioral: Negative.   All other systems reviewed and are negative.    Objective: Vital Signs: There were no vitals taken for this visit.  Physical Exam  Constitutional: She is oriented to person, place, and time. She appears well-developed and well-nourished.  Pulmonary/Chest: Effort normal.  Neurological: She is alert and oriented to person, place, and time.  Skin: Skin is warm. Capillary refill takes less than 2 seconds.  Psychiatric: She has a normal mood and affect. Her behavior is normal. Judgment and thought content normal.  Nursing note and vitals reviewed.   Ortho Exam Right elbow exam is consistent with lateral epicondylitis. Negative for radial tunnel syndrome Specialty Comments:  No specialty comments available.  Imaging: No results found.   PMFS History: Patient Active Problem List   Diagnosis Date Noted  . Infection of urinary tract 10/01/2013  . Spinal stenosis in cervical region 09/30/2013  . HNP (herniated nucleus pulposus), cervical 09/30/2013  . 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 History of vagotomy 11/23/2010  . History of laparoscopic adjustable gastric banding 11/23/2010   Past Medical History:  Diagnosis Date  . Anxiety    mild  . Arthritis   . Constipation   . Diabetes  mellitus    Prediabetes when pregnant (gestational)  . Diarrhea   . Fatigue   . GERD (gastroesophageal reflux disease)   . Hyperlipidemia   . Hypertension    gestational  . Migraines   . Migraines   . Nausea and vomiting 01-23-11   lap band being coverted to Gastric bypass  . PONV (postoperative nausea and vomiting)    -PONV  . Trouble swallowing   . Vertigo 01-23-11   recent -took Meclicine-now improved    Family History  Problem Relation Age of Onset  . Diabetes Mother     Past Surgical History:   Procedure Laterality Date  . ANTERIOR CERVICAL DECOMP/DISCECTOMY FUSION N/A 09/30/2013   Procedure: C5-6, C6-7 Anterior Cervical Discectomy and Fusion, Allograft, Plate;  Surgeon: Eldred MangesMark C Yates, MD;  Location: [facility];  Service: Orthopedics;  Laterality: N/A;  . childbirth  01-23-11   NVD  . GASTRIC ROUX-EN-Y  01/29/2011   Procedure: LAPAROSCOPIC ROUX-EN-Y GASTRIC;  Surgeon: Valarie MerinoMatthew B Martin, MD;  Location: [facility];  Service: General;  Laterality: N/A;  Removal of Lap Band Conversion to Roux Y Gastric Bypass   . KNEE CARTILAGE SURGERY  2004   right knee  . LAPAROSCOPIC GASTRIC BANDING  11/2004  . TUBAL LIGATION     Social History   Occupational History  . Not on file.   Social History Main Topics  . Smoking status: Current Some Day Smoker    Packs/day: 0.25    Years: 25.00    Last attempt to quit: 10/23/2010  . Smokeless tobacco: Never Used     Comment: started back smoking  . Alcohol use No     Comment: ocasional wine  . Drug use: No  . Sexual activity: Yes

## 2016-08-22 ENCOUNTER — Encounter (HOSPITAL_COMMUNITY): Payer: Self-pay

## 2021-03-14 ENCOUNTER — Ambulatory Visit: Payer: Self-pay

## 2021-03-14 ENCOUNTER — Other Ambulatory Visit: Payer: Self-pay

## 2021-03-14 ENCOUNTER — Ambulatory Visit: Payer: BC Managed Care – PPO | Admitting: Orthopaedic Surgery

## 2021-03-14 DIAGNOSIS — M7712 Lateral epicondylitis, left elbow: Secondary | ICD-10-CM | POA: Diagnosis not present

## 2021-03-14 NOTE — Progress Notes (Signed)
Office Visit Note   Patient: [name]           Date of Birth: [birth year]           MRN: [id number] Visit Date: 03/14/2021              Requested by: Lavina Hamman, MD [address], [address] [place],  [place] [address] PCP: Lavina Hamman, MD   Assessment & Plan: Visit Diagnoses:  1. Left tennis elbow     Plan: Impression is left tennis elbow.  We have discussed treating this with exercise as well as a strap, however the patient would also like to proceed with cortisone injection.  He will follow-up with [place] as needed.  Call with concerns or questions.  Follow-Up Instructions: Return if symptoms worsen or fail to improve.   Orders:  Orders Placed This Encounter  Procedures   XR Elbow Complete Left (3+View)   No orders of the defined types were placed in this encounter.     Procedures: Medium Joint Inj: L lateral epicondyle on 03/15/2021 4:28 AM Details: 22 G needle Medications: 1 mL lidocaine 1 %; 40 mg methylPREDNISolone acetate 40 MG/ML; 1 mL bupivacaine 0.5 % Outcome: tolerated well, no immediate complications     Clinical Data: No additional findings.   Subjective: Chief Complaint  Patient presents with   Left Elbow - Pain    HPI patient is a pleasant 56 year old female who comes in today with left lateral elbow pain.  This began just prior to Christmas.  No specific injury or change in activity that she is aware of.  Her pain appears to have worsened.  Symptoms are worse when she is picking up anything with weight such as her coffee cup.  She has been taking Tylenol without relief.  She does note occasional tingling to her fingers especially when sleeping at night.  She does have a history of right tennis elbow that significantly improved following injection.  Review of Systems as detailed in HPI.  All others reviewed and are negative.   Objective: Vital Signs: There were no vitals taken for this visit.  Physical Exam well-developed  well-nourished female in no acute distress.  Alert and oriented x3.  Ortho Exam left elbow exam shows moderate tenderness to the lateral epicondyle.  She does have mild tenderness to the radial tunnel.  Increased pain with gripping.  Increased pain with supination as well as resisted long finger extension.  She is neurovascular intact distally.  Specialty Comments:  No specialty comments available.  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 02/01/2011   History of vagotomy 11/23/2010   History of laparoscopic adjustable gastric banding 11/23/2010   Past Medical History:  Diagnosis Date   Anxiety    mild   Arthritis    Constipation    Diabetes mellitus    Prediabetes when pregnant (gestational)   Diarrhea    Fatigue    GERD (gastroesophageal reflux disease)    Hyperlipidemia    Hypertension    gestational   Migraines    Migraines    Nausea and vomiting 01-23-11   lap band being coverted  to Gastric bypass   PONV (postoperative nausea and vomiting)    -PONV   Trouble swallowing    Vertigo 01-23-11   recent -took Meclicine-now improved    Family History  Problem Relation Age of Onset   Diabetes Mother     Past Surgical History:  Procedure Laterality Date   ANTERIOR CERVICAL DECOMP/DISCECTOMY FUSION N/A 09/30/2013   Procedure: C5-6, C6-7 Anterior Cervical Discectomy and Fusion, Allograft, Plate;  Surgeon: Eldred Manges, MD;  Location: [facility];  Service: Orthopedics;  Laterality: N/A;   childbirth  01-23-11   NVD   GASTRIC ROUX-EN-Y  01/29/2011   Procedure: LAPAROSCOPIC ROUX-EN-Y GASTRIC;  Surgeon: Valarie Merino, MD;  Location: [facility];   Service: General;  Laterality: N/A;  Removal of Lap Band Conversion to Roux Y Gastric Bypass    KNEE CARTILAGE SURGERY  2004   right knee   LAPAROSCOPIC GASTRIC BANDING  11/2004   TUBAL LIGATION     Social History   Occupational History   Not on file  Tobacco Use   Smoking status: Some Days    Packs/day: 0.25    Years: 25.00    Pack years: 6.25    Types: Cigarettes    Last attempt to quit: 10/23/2010    Years since quitting: 10.3   Smokeless tobacco: Never   Tobacco comments:    started back smoking  Substance and Sexual Activity   Alcohol use: No    Comment: ocasional wine   Drug use: No   Sexual activity: Yes

## 2021-03-15 DIAGNOSIS — M7712 Lateral epicondylitis, left elbow: Secondary | ICD-10-CM | POA: Diagnosis not present

## 2021-03-15 MED ORDER — LIDOCAINE HCL 1 % IJ SOLN
1.0000 mL | INTRAMUSCULAR | Status: AC | PRN
  Administered 2021-03-15: 1 mL

## 2021-03-15 MED ORDER — BUPIVACAINE HCL 0.5 % IJ SOLN
1.0000 mL | INTRAMUSCULAR | Status: AC | PRN
  Administered 2021-03-15: 1 mL via INTRA_ARTICULAR

## 2021-03-15 MED ORDER — METHYLPREDNISOLONE ACETATE 40 MG/ML IJ SUSP
40.0000 mg | INTRAMUSCULAR | Status: AC | PRN
  Administered 2021-03-15: 40 mg via INTRA_ARTICULAR

## 2021-06-13 ENCOUNTER — Encounter: Payer: Self-pay | Admitting: Orthopaedic Surgery

## 2021-06-13 ENCOUNTER — Ambulatory Visit: Payer: BC Managed Care – PPO | Admitting: Orthopaedic Surgery

## 2021-06-13 DIAGNOSIS — M7712 Lateral epicondylitis, left elbow: Secondary | ICD-10-CM

## 2021-06-13 MED ORDER — METHYLPREDNISOLONE ACETATE 40 MG/ML IJ SUSP
40.0000 mg | INTRAMUSCULAR | Status: AC | PRN
  Administered 2021-06-13: 40 mg via INTRA_ARTICULAR

## 2021-06-13 MED ORDER — BUPIVACAINE HCL 0.5 % IJ SOLN
1.0000 mL | INTRAMUSCULAR | Status: AC | PRN
  Administered 2021-06-13: 1 mL via INTRA_ARTICULAR

## 2021-06-13 MED ORDER — LIDOCAINE HCL 1 % IJ SOLN
1.0000 mL | INTRAMUSCULAR | Status: AC | PRN
  Administered 2021-06-13: 1 mL

## 2021-06-13 NOTE — Progress Notes (Signed)
Office Visit Note   Patient: [name]           Date of Birth: Feb 16, 1965           MRN: [id number] Visit Date: 06/13/2021              Requested by: Lavina Hamman, MD [address] [place],  [place] [address] PCP: Lavina Hamman, MD   Assessment & Plan: Visit Diagnoses:  1. Left tennis elbow     Plan: Impression is acute worsening of left elbow lateral epicondylitis.  I strongly urged her not to do another cortisone injection until we have more information by getting an MRI but the patient refused.  Cortisone injection performed today.  She understands the risks doing another cortisone injection including degradation of the tendon and creating a larger tear which may need surgery down the road.  Follow-Up Instructions: No follow-ups on file.   Orders:  Orders Placed This Encounter  Procedures   Medium Joint Inj   No orders of the defined types were placed in this encounter.     Procedures: Medium Joint Inj: L lateral epicondyle on 06/13/2021 4:27 PM Details: 22 G needle Medications: 1 mL lidocaine 1 %; 40 mg methylPREDNISolone acetate 40 MG/ML; 1 mL bupivacaine 0.5 % Outcome: tolerated well, no immediate complications     Clinical Data: No additional findings.   Subjective: Chief Complaint  Patient presents with   Left Elbow - Pain    HPI [name] returns today for recurrent left elbow pain.  Underwent tennis elbow injection about 3 months ago which helped until April when she went on spring break and had to pick up a beach chair and felt acute pain.  Also noticed that she has had swelling.  The pain is better with rest and worse with activity.  Review of Systems  Constitutional: Negative.   HENT: Negative.    Eyes: Negative.   Respiratory: Negative.    Cardiovascular: Negative.   Endocrine: Negative.   Musculoskeletal: Negative.   Neurological: Negative.   Hematological: Negative.   Psychiatric/Behavioral: Negative.    All other  systems reviewed and are negative.   Objective: Vital Signs: There were no vitals taken for this visit.  Physical Exam Vitals and nursing note reviewed.  Constitutional:      Appearance: She is well-developed.  Pulmonary:     Effort: Pulmonary effort is normal.  Skin:    General: Skin is warm.     Capillary Refill: Capillary refill takes less than 2 seconds.  Neurological:     Mental Status: She is alert and oriented to person, place, and time.  Psychiatric:        Behavior: Behavior normal.        Thought Content: Thought content normal.        Judgment: Judgment normal.    Ortho Exam Examination left elbow shows mild swelling.  Tenderness to the common extensor tendon origin.  Pain with grasping and gripping. Specialty Comments:  No specialty comments available.  Imaging: No results found.   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 02/01/2011   History of vagotomy 11/23/2010   History of laparoscopic adjustable gastric banding 11/23/2010  Past Medical History:  Diagnosis Date   Anxiety    mild   Arthritis    Constipation    Diabetes mellitus    Prediabetes when pregnant (gestational)   Diarrhea    Fatigue    GERD (gastroesophageal reflux disease)    Hyperlipidemia    Hypertension    gestational   Migraines    Migraines    Nausea and vomiting 01-23-11   lap band being coverted to Gastric bypass   PONV (postoperative nausea and vomiting)    -PONV   Trouble swallowing    Vertigo 01-23-11   recent -took Meclicine-now improved    Family History  Problem Relation Age of Onset   Diabetes Mother     Past Surgical History:  Procedure Laterality Date   ANTERIOR CERVICAL DECOMP/DISCECTOMY FUSION  N/A 09/30/2013   Procedure: C5-6, C6-7 Anterior Cervical Discectomy and Fusion, Allograft, Plate;  Surgeon: Eldred Manges, MD;  Location: [facility];  Service: Orthopedics;  Laterality: N/A;   childbirth  01-23-11   NVD   GASTRIC ROUX-EN-Y  01/29/2011   Procedure: LAPAROSCOPIC ROUX-EN-Y GASTRIC;  Surgeon: Valarie Merino, MD;  Location: [facility];  Service: General;  Laterality: N/A;  Removal of Lap Band Conversion to Roux Y Gastric Bypass    KNEE CARTILAGE SURGERY  2004   right knee   LAPAROSCOPIC GASTRIC BANDING  11/2004   TUBAL LIGATION     Social History   Occupational History   Not on file  Tobacco Use   Smoking status: Some Days    Packs/day: 0.25    Years: 25.00    Pack years: 6.25    Types: Cigarettes    Last attempt to quit: 10/23/2010    Years since quitting: 10.6   Smokeless tobacco: Never   Tobacco comments:    started back smoking  Substance and Sexual Activity   Alcohol use: No    Comment: ocasional wine   Drug use: No   Sexual activity: Yes

## 2021-12-20 ENCOUNTER — Ambulatory Visit: Payer: BC Managed Care – PPO | Admitting: Orthopaedic Surgery

## 2022-05-07 ENCOUNTER — Ambulatory Visit: Payer: BC Managed Care – PPO | Admitting: Orthopaedic Surgery

## 2022-05-07 ENCOUNTER — Encounter: Payer: Self-pay | Admitting: Orthopaedic Surgery

## 2022-05-07 DIAGNOSIS — M7712 Lateral epicondylitis, left elbow: Secondary | ICD-10-CM | POA: Diagnosis not present

## 2022-05-07 MED ORDER — METHYLPREDNISOLONE ACETATE 40 MG/ML IJ SUSP
40.0000 mg | INTRAMUSCULAR | Status: AC | PRN
Start: 2022-05-07 — End: 2022-05-07
  Administered 2022-05-07: 40 mg

## 2022-05-07 MED ORDER — LIDOCAINE HCL 1 % IJ SOLN
2.0000 mL | INTRAMUSCULAR | Status: AC | PRN
Start: 2022-05-07 — End: 2022-05-07
  Administered 2022-05-07: 2 mL

## 2022-05-07 NOTE — Progress Notes (Signed)
The patient is a 57 year old well-known to me.  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  Injections have helped in the past and she just wants to try an injection again today.  On exam her left elbow does have pain over the lateral epicondyle.  Her motion is full in the left elbow is ligamentously stable.  I did provide a steroid injection over the lateral epicondyle area of the left elbow.  She tolerated this well.  She knows to try stretching exercises and avoid repetitive activities with her hand in a pronated position.  Follow-up is as needed.  All questions and concerns were addressed and answered.     Procedure Note  Patient: [name]             Date of Birth: [birth year]           MRN: [id number]             Visit Date: 05/07/2022  Procedures: Visit Diagnoses:  1. Left tennis elbow     Hand/UE Inj: L elbow for lateral epicondylitis on 05/07/2022 4:03 PM Medications: 2 mL lidocaine 1 %; 40 mg methylPREDNISolone acetate 40 MG/ML

## 2022-08-06 ENCOUNTER — Telehealth: Payer: Self-pay | Admitting: Orthopaedic Surgery

## 2022-08-06 NOTE — Telephone Encounter (Signed)
Patient call to be seen about her elbow.CB#[phone]

## 2022-08-15 ENCOUNTER — Ambulatory Visit: Payer: BC Managed Care – PPO | Admitting: Orthopaedic Surgery

## 2022-08-15 ENCOUNTER — Encounter: Payer: Self-pay | Admitting: Orthopaedic Surgery

## 2022-08-15 DIAGNOSIS — M7712 Lateral epicondylitis, left elbow: Secondary | ICD-10-CM

## 2022-08-15 MED ORDER — LIDOCAINE HCL 1 % IJ SOLN
1.0000 mL | INTRAMUSCULAR | Status: AC | PRN
Start: 2022-08-15 — End: 2022-08-15
  Administered 2022-08-15: 1 mL

## 2022-08-15 MED ORDER — METHYLPREDNISOLONE ACETATE 40 MG/ML IJ SUSP
40.0000 mg | INTRAMUSCULAR | Status: AC | PRN
Start: 2022-08-15 — End: 2022-08-15
  Administered 2022-08-15: 40 mg

## 2022-08-15 NOTE — Progress Notes (Signed)
The patient is well-known to me.  She has been dealing with left elbow lateral epicondylitis for some time now.  An injection in that area back in April did help quite a bit and she would like to have another one today.  She is been working on stretching exercises well.  The steroid has caused some pigmentation changes in her skin given that she has darker skin complexion.  On exam her elbow is ligamentously stable on the left side and her pain is consistent with tenderness over the lateral epicondyle area.  Per request I did place a steroid injection and see her again today which she tolerated well.  All questions and concerns were answered and addressed.  Follow-up is as needed.    Procedure Note  Patient: [name]             Date of Birth: January 16, 1965           MRN: [id number]             Visit Date: 08/15/2022  Procedures: Visit Diagnoses:  1. Left tennis elbow     Hand/UE Inj: L elbow for lateral epicondylitis on 08/15/2022 3:46 PM Medications: 1 mL lidocaine 1 %; 40 mg methylPREDNISolone acetate 40 MG/ML

## 2022-08-28 ENCOUNTER — Ambulatory Visit: Payer: BC Managed Care – PPO | Admitting: Orthopaedic Surgery

## 2022-11-28 ENCOUNTER — Encounter: Payer: Self-pay | Admitting: Orthopaedic Surgery

## 2022-11-28 ENCOUNTER — Ambulatory Visit: Payer: BC Managed Care – PPO | Admitting: Orthopaedic Surgery

## 2022-11-28 DIAGNOSIS — M7712 Lateral epicondylitis, left elbow: Secondary | ICD-10-CM

## 2022-11-28 MED ORDER — METHYLPREDNISOLONE ACETATE 40 MG/ML IJ SUSP
40.0000 mg | INTRAMUSCULAR | Status: AC | PRN
Start: 2022-11-28 — End: 2022-11-28
  Administered 2022-11-28: 40 mg

## 2022-11-28 MED ORDER — LIDOCAINE HCL 1 % IJ SOLN
1.0000 mL | INTRAMUSCULAR | Status: AC | PRN
Start: 2022-11-28 — End: 2022-11-28
  Administered 2022-11-28: 1 mL

## 2022-11-28 NOTE — Progress Notes (Signed)
The patient is well-known to [place].  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  She comes in today requesting a steroid injection.  She last had a steroid injection in her left elbow in July of this year.  She had this on the right side before but it is eventually gone away.  The left side has been waking her up at night.  It still seems that repetitive activities or work relating to this chronic situation.  I did talk to her about referral for surgical evaluation but she still wants to try injections which I still think is appropriate.  On exam she has excellent range of motion of her left elbow and the pain is definitely over the lateral epicondylar area of the elbow and consistent with lateral epicondylitis.  I did place a steroid injection per her request over the left elbow lateral epicondyle area which she tolerated well.  Follow-up can be as needed.    Procedure Note  Patient: [name]             Date of Birth: [birth year]           MRN: [id number]             Visit Date: 11/28/2022  Procedures: Visit Diagnoses:  1. Left tennis elbow     Hand/UE Inj: L elbow for lateral epicondylitis on 11/28/2022 4:33 PM Medications: 1 mL lidocaine 1 %; 40 mg methylPREDNISolone acetate 40 MG/ML

## 2023-04-11 ENCOUNTER — Ambulatory Visit: Payer: Self-pay | Admitting: Physician Assistant

## 2023-04-11 ENCOUNTER — Other Ambulatory Visit (INDEPENDENT_AMBULATORY_CARE_PROVIDER_SITE_OTHER): Payer: Self-pay

## 2023-04-11 DIAGNOSIS — M7712 Lateral epicondylitis, left elbow: Secondary | ICD-10-CM

## 2023-04-11 DIAGNOSIS — M25561 Pain in right knee: Secondary | ICD-10-CM | POA: Diagnosis not present

## 2023-04-11 MED ORDER — LIDOCAINE HCL 1 % IJ SOLN
3.0000 mL | INTRAMUSCULAR | Status: AC | PRN
  Administered 2023-04-11: 3 mL

## 2023-04-11 MED ORDER — METHYLPREDNISOLONE ACETATE 40 MG/ML IJ SUSP
40.0000 mg | INTRAMUSCULAR | Status: AC | PRN
Start: 2023-04-11 — End: 2023-04-11
  Administered 2023-04-11: 40 mg via INTRA_ARTICULAR

## 2023-04-11 MED ORDER — DICLOFENAC SODIUM 75 MG PO TBEC: 75.0000 mg | DELAYED_RELEASE_TABLET | Freq: Two times a day (BID) | ORAL | 1 refills | Status: DC

## 2023-04-11 NOTE — Progress Notes (Signed)
 Office Visit Note   Patient: [name]           Date of Birth: [birth year]           MRN: [id number] Visit Date: 04/11/2023              Requested by: Lavina Hamman, MD [address], [address] [place],  [place] [address] PCP: Lavina Hamman, MD   Assessment & Plan: Visit Diagnoses:  1. Acute pain of right knee   2. Left tennis elbow     Plan:  We will send her for evaluation for possible shockwave therapy to the left elbow for lateral epicondylitis with Dr. Shon Baton.  In regards to the knee recommend quad strengthening.  Also discussed with her aspiration injection in the knee today she was agreeable.  See her back in 2 weeks see how the knee is doing overall.  Questions were encouraged and answered at length.  She is given a prescription for diclofenac.   Follow-Up Instructions: Return in about 2 weeks (around 04/25/2023).   Orders:  Orders Placed This Encounter  Procedures   XR Knee 1-2 Views Right   Meds ordered this encounter  Medications   diclofenac (VOLTAREN) 75 MG EC tablet    Sig: Take 1 tablet (75 mg total) by mouth 2 (two) times daily.    Dispense:  60 tablet    Refill:  1      Procedures: Large Joint Inj: R knee on 04/11/2023 5:13 PM Indications: pain Details: 22 G 1.5 in needle, anterolateral approach  Arthrogram: No  Medications: 3 mL lidocaine 1 %; 40 mg methylPREDNISolone acetate 40 MG/ML Aspirate: 13 mL yellow Outcome: tolerated well, no immediate complications Procedure, treatment alternatives, risks and benefits explained, specific risks discussed. Consent was given by the patient. Immediately prior to procedure a time out was called to verify the correct patient, procedure, equipment, support staff and site/side marked as required. Patient was prepped and draped in the usual sterile fashion.       Clinical Data: No additional findings.   Subjective: Chief Complaint  Patient presents with   Right Knee - Pain    HPI Mr. Luepke  is a 58 year old female well-known Dr. Eliberto Ivory service comes in today due to right knee pain that started 3 weeks ago no injury.  Pains posterior medial aspect sharp.  Pains worse with any activity.  She notes giving way at times.  Throbs at night.  She does have a history of knee arthroscopy but is unsure which knee.  She has been taking some diclofenac which has helped with her knee pain is also helped with her left elbow pain she continues to have lateral elbow pain despite cortisone injections in the past.  Cortisone injections helped or not long-lasting.  Review of Systems  Constitutional:  Negative for chills and fever.     Objective: Vital Signs: There were no vitals taken for this visit.  Physical Exam Constitutional:      Appearance: She is not ill-appearing or diaphoretic.  Pulmonary:     Effort: Pulmonary effort is normal.  Neurological:     Mental Status: She is alert and oriented to person, place, and time.  Psychiatric:        Mood and Affect: Mood normal.     Ortho Exam Left elbow good range of motion.  Nontender over the medial aspect of the elbow and the olecranon portion of elbow.  Tenderness over the lateral epicondyle.  Provocative maneuvers  cause lateral epicondyle pain. Bilateral knees: Good range of motion both knees no instability valgus varus stressing.  Right knee slight effusion.  Left knee no effusion abnormal warmth.  Tenderness right knee medial joint line.  Significant patellofemoral crepitus right knee with range of motion.  Specialty Comments:  No specialty comments available.  Imaging: XR Knee 1-2 Views Right Result Date: 04/11/2023 Right knee 2 views: Knee is well located.  Mild patellofemoral changes.  Mild medial joint line narrowing.  Lateral joint lines well-preserved.  No dislocation.  No other bony abnormalities.    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 02/01/2011   History of vagotomy 11/23/2010   History of laparoscopic adjustable gastric banding 11/23/2010   Past Medical History:  Diagnosis Date   Anxiety    mild   Arthritis    Constipation    Diabetes mellitus    Prediabetes when pregnant (gestational)   Diarrhea    Fatigue    GERD (gastroesophageal reflux disease)    Hyperlipidemia    Hypertension    gestational   Migraines    Migraines    Nausea and vomiting 01-23-11   lap band being coverted to Gastric bypass   PONV (postoperative nausea and vomiting)    -PONV   Trouble swallowing    Vertigo 01-23-11   recent -took Meclicine-now improved    Family History  Problem Relation Age of Onset   Diabetes Mother     Past Surgical History:  Procedure Laterality Date   ANTERIOR CERVICAL DECOMP/DISCECTOMY FUSION N/A 09/30/2013   Procedure: C5-6, C6-7 Anterior Cervical Discectomy and Fusion, Allograft, Plate;  Surgeon: Eldred Manges, MD;  Location: [facility];  Service: Orthopedics;  Laterality: N/A;   childbirth  01-23-11   NVD   GASTRIC ROUX-EN-Y  01/29/2011   Procedure: LAPAROSCOPIC ROUX-EN-Y GASTRIC;  Surgeon: Valarie Merino, MD;  Location: [facility];  Service: General;  Laterality: N/A;  Removal of Lap Band Conversion to Roux Y Gastric Bypass    KNEE CARTILAGE SURGERY  2004   right knee   LAPAROSCOPIC GASTRIC BANDING  11/2004   TUBAL LIGATION     Social History   Occupational History   Not on file  Tobacco Use   Smoking status: Some Days    Current packs/day: 0.00    Average packs/day: 0.3 packs/day for 25.0 years (6.3 ttl pk-yrs)    Types: Cigarettes    Start date: 10/22/1985    Last attempt to quit: 10/23/2010    Years since quitting: 12.4   Smokeless tobacco: Never   Tobacco comments:    started back smoking  Substance and Sexual  Activity   Alcohol use: No    Comment: ocasional wine   Drug use: No   Sexual activity: Yes

## 2023-04-15 ENCOUNTER — Other Ambulatory Visit: Payer: Self-pay | Admitting: Radiology

## 2023-04-15 DIAGNOSIS — M7712 Lateral epicondylitis, left elbow: Secondary | ICD-10-CM

## 2023-04-22 ENCOUNTER — Ambulatory Visit: Payer: Self-pay | Admitting: Orthopaedic Surgery

## 2023-04-26 ENCOUNTER — Encounter: Admitting: Sports Medicine

## 2023-05-06 ENCOUNTER — Telehealth: Payer: Self-pay | Admitting: Physician Assistant

## 2023-05-06 ENCOUNTER — Other Ambulatory Visit: Payer: Self-pay

## 2023-05-06 DIAGNOSIS — G8929 Other chronic pain: Secondary | ICD-10-CM

## 2023-05-06 NOTE — Telephone Encounter (Signed)
 Patient says it's her knee. She is still in so much pain. Would like someone to call her. She can not get in her mychart.

## 2023-05-06 NOTE — Telephone Encounter (Signed)
 Patient's husband called. Says she is in a lot of pain. Would like a call to know what to do.

## 2023-05-06 NOTE — Telephone Encounter (Signed)
 Husband aware of below message

## 2023-05-07 ENCOUNTER — Encounter: Payer: Self-pay | Admitting: Orthopaedic Surgery

## 2023-05-12 ENCOUNTER — Ambulatory Visit
Admission: RE | Admit: 2023-05-12 | Discharge: 2023-05-12 | Disposition: A | Discharge status: Home / Self Care | Source: Ambulatory Visit | Attending: Orthopaedic Surgery | Admitting: Orthopaedic Surgery

## 2023-05-12 DIAGNOSIS — G8929 Other chronic pain: Secondary | ICD-10-CM

## 2023-05-15 ENCOUNTER — Encounter: Payer: Self-pay | Admitting: Orthopaedic Surgery

## 2023-05-15 ENCOUNTER — Ambulatory Visit: Admitting: Orthopaedic Surgery

## 2023-05-15 DIAGNOSIS — G8929 Other chronic pain: Secondary | ICD-10-CM | POA: Diagnosis not present

## 2023-05-15 DIAGNOSIS — M25561 Pain in right knee: Secondary | ICD-10-CM | POA: Diagnosis not present

## 2023-05-15 MED ORDER — METHYLPREDNISOLONE 4 MG PO TABS: ORAL_TABLET | ORAL | 0 refills | Status: AC

## 2023-05-15 MED ORDER — CELECOXIB 200 MG PO CAPS: 200.0000 mg | ORAL_CAPSULE | Freq: Two times a day (BID) | ORAL | 1 refills | Status: AC | PRN

## 2023-05-15 NOTE — Progress Notes (Signed)
 The patient comes in today to go over MRI of her right knee.  She has had 2 months of significant pain in her right knee but is also waking her up at night.  She points to the medial aspect of her knee as a source of her pain.  There is been no known injury.  Her plain films showed normal-appearing joint space and a steroid injection in her knee last month only helped for about a week.  She is not a diabetic.  She has had a remote history of arthroscopic surgery on that knee many years ago.  Examination of her right knee today shows no effusion.  She has a lot of tenderness throughout the arc of motion of the knee but there is no instability on exam.  Most of her pain is on the medial aspect of the knee.  MRI of the right knee showed no acute findings.  Fortunately her cartilage is only thinning so there is some arthritis in her knee but there is no full-thickness cartilage loss.  There was no meniscal tearing.  There is thickening of the MCL suggesting a sprain.   She has been wearing a knee sleeve.  I would like to try a knee brace that did offer some more support like a hinged knee brace.  Also would like to start her on Celebrex instead of diclofenac  and a steroid taper orally.  She is a good candidate for hyaluronic acid as well as we will order this for her knee.  They agree with this treatment plan.  This patient is diagnosed with osteoarthritis of the knee(s).    Radiographs show evidence of joint space narrowing, osteophytes, subchondral sclerosis and/or subchondral cysts.  This patient has knee pain which interferes with functional and activities of daily living.    This patient has experienced inadequate response, adverse effects and/or intolerance with conservative treatments such as acetaminophen , NSAIDS, topical creams, physical therapy or regular exercise, knee bracing and/or weight loss.   This patient has experienced inadequate response or has a contraindication to intra articular  steroid injections for at least 3 months.   This patient is not scheduled to have a total knee replacement within 6 months of starting treatment with viscosupplementation.

## 2023-05-16 ENCOUNTER — Telehealth: Payer: Self-pay

## 2023-05-16 NOTE — Telephone Encounter (Signed)
 Right knee gel injection

## 2023-05-21 ENCOUNTER — Telehealth: Payer: Self-pay | Admitting: Orthopaedic Surgery

## 2023-05-21 NOTE — Telephone Encounter (Signed)
 Pt called about update for gel injection. Please call pt at [phone]

## 2023-05-22 NOTE — Telephone Encounter (Signed)
 Talked with patient and advised her that the gel injection process can take up to 2-3 weeks depending on insurance and if an authorization is required.  [name] does require an authorization.  Patient voiced that she understands

## 2023-05-22 NOTE — Telephone Encounter (Signed)
 VOB submitted for Monovisc, right knee

## 2023-05-31 ENCOUNTER — Telehealth: Payer: Self-pay | Admitting: Orthopaedic Surgery

## 2023-05-31 NOTE — Telephone Encounter (Signed)
 Pt called requesting a call with update on gel injection. Pt phone number is [phone].

## 2023-06-03 NOTE — Telephone Encounter (Signed)
 Talked with Dorita Garter, clinical representative with [facility] specialty pharmacy and was advised that PA is pending. Pending auth.# [id number]  Talked with patient and advised her that PA is pending for Monovisc injection.  Advised patient that this is taking longer due to the medication having to go through the specialty pharmacy with her insurance. Voiced that she understands Appt.has been scheduled

## 2023-06-06 ENCOUNTER — Other Ambulatory Visit: Payer: Self-pay

## 2023-06-06 DIAGNOSIS — G8929 Other chronic pain: Secondary | ICD-10-CM

## 2023-06-18 ENCOUNTER — Encounter: Payer: Self-pay | Admitting: Physician Assistant

## 2023-06-18 ENCOUNTER — Ambulatory Visit: Admitting: Physician Assistant

## 2023-06-18 DIAGNOSIS — M7051 Other bursitis of knee, right knee: Secondary | ICD-10-CM

## 2023-06-18 DIAGNOSIS — M1611 Unilateral primary osteoarthritis, right hip: Secondary | ICD-10-CM | POA: Diagnosis not present

## 2023-06-18 DIAGNOSIS — M25561 Pain in right knee: Secondary | ICD-10-CM

## 2023-06-18 MED ORDER — LIDOCAINE HCL 1 % IJ SOLN
1.0000 mL | INTRAMUSCULAR | Status: AC | PRN
  Administered 2023-06-18: 1 mL

## 2023-06-18 MED ORDER — HYALURONAN 88 MG/4ML IX SOSY
88.0000 mg | PREFILLED_SYRINGE | INTRA_ARTICULAR | Status: AC | PRN
  Administered 2023-06-18: 88 mg via INTRA_ARTICULAR

## 2023-06-18 MED ORDER — METHYLPREDNISOLONE ACETATE 40 MG/ML IJ SUSP
40.0000 mg | INTRAMUSCULAR | Status: AC | PRN
  Administered 2023-06-18: 40 mg via INTRAMUSCULAR

## 2023-06-18 NOTE — Progress Notes (Signed)
   Procedure Note  Patient: [name]             Date of Birth: August 28, 1965           MRN: [id number]             Visit Date: 06/18/2023 HPI: Patient returns today for right knee Monovisc injection.  She has multiple questions prior to the injection including cost.  Reviewed the possible causes that she could endure with this given the information we have from her insurance.  She did decide to proceed with the injection.  She is also having tenderness medial asked to the knee just below the joint line. She has had inadequate response with conservative measures which included NSAIDs therapy exercise and cortisone injection.  She has no scheduled right knee surgical interventions in the next 6 months.   Physical exam: Right knee no effusion no abnormal warmth erythema.  She has tenderness along the medial and lateral joint line most of her pain is posterior medial aspect of the knee.  No gross instability valgus varus stressing.  Anterior drawer is negative. Procedures: Visit Diagnoses:  1. Pes anserinus bursitis of right knee   2. Acute pain of right knee     Large Joint Inj: R knee on 06/18/2023 8:42 PM Indications: pain Details: 22 G 1.5 in needle, anterolateral approach  Arthrogram: No  Medications: 88 mg Hyaluronan 88 MG/4ML Outcome: tolerated well, no immediate complications Procedure, treatment alternatives, risks and benefits explained, specific risks discussed. Consent was given by the patient. Immediately prior to procedure a time out was called to verify the correct patient, procedure, equipment, support staff and site/side marked as required. Patient was prepped and draped in the usual sterile fashion.    Trigger Point Inj  Date/Time: 06/18/2023 8:42 PM  Performed by: Bronson Canny, PA-C Authorized by: Bronson Canny, PA-C   Consent Given by:  Patient Site marked: the procedure site was marked   Timeout: prior to procedure the correct patient, procedure, and site was  verified   Total # of Trigger Points:  1 Location: lower extremity   Needle Size:  25 G Approach:  Medial Medications #1:  1 mL lidocaine  1 %; 40 mg methylPREDNISolone  acetate 40 MG/ML Patient tolerance:  Patient tolerated the procedure well with no immediate complications  Plan: Will see her back in 4 to 6 weeks to see what type of response she had to do both of these injections.  Quad strengthening is encouraged.  Questions encouraged and answered.

## 2023-06-28 ENCOUNTER — Other Ambulatory Visit: Payer: Self-pay | Admitting: Physician Assistant

## 2023-07-31 ENCOUNTER — Ambulatory Visit: Admitting: Physician Assistant

## 2023-08-24 ENCOUNTER — Other Ambulatory Visit: Payer: Self-pay | Admitting: Orthopaedic Surgery

## 2023-10-22 ENCOUNTER — Other Ambulatory Visit: Payer: Self-pay | Admitting: Orthopaedic Surgery

## 2023-11-18 ENCOUNTER — Encounter: Payer: Self-pay | Admitting: Radiology

## 2024-01-03 ENCOUNTER — Other Ambulatory Visit: Payer: Self-pay | Admitting: Orthopaedic Surgery
